# Patient Record
Sex: Female | Born: 1985 | Race: White | Hispanic: No | Marital: Married | Smoking: Never smoker
Health system: Southern US, Community
[De-identification: ages and names within clinical notes are randomized; demographics above are authoritative.]

## PROBLEM LIST (undated history)

## (undated) DIAGNOSIS — N979 Female infertility, unspecified: Secondary | ICD-10-CM

## (undated) DIAGNOSIS — F419 Anxiety disorder, unspecified: Secondary | ICD-10-CM

## (undated) DIAGNOSIS — Z8742 Personal history of other diseases of the female genital tract: Secondary | ICD-10-CM

## (undated) DIAGNOSIS — I499 Cardiac arrhythmia, unspecified: Secondary | ICD-10-CM

## (undated) DIAGNOSIS — I480 Paroxysmal atrial fibrillation: Secondary | ICD-10-CM

## (undated) DIAGNOSIS — O24419 Gestational diabetes mellitus in pregnancy, unspecified control: Secondary | ICD-10-CM

## (undated) HISTORY — PX: WISDOM TOOTH EXTRACTION: SHX21

## (undated) HISTORY — PX: BREAST SURGERY: SHX581

## (undated) HISTORY — PX: DILATION AND CURETTAGE OF UTERUS: SHX78

## (undated) HISTORY — DX: Paroxysmal atrial fibrillation: I48.0

## (undated) HISTORY — DX: Anxiety disorder, unspecified: F41.9

## (undated) HISTORY — DX: Gestational diabetes mellitus in pregnancy, unspecified control: O24.419

## (undated) HISTORY — DX: Personal history of other diseases of the female genital tract: Z87.42

## (undated) HISTORY — PX: OTHER SURGICAL HISTORY: SHX169

---
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
## 2009-09-29 ENCOUNTER — Ambulatory Visit (HOSPITAL_COMMUNITY): Admission: RE | Admit: 2009-09-29 | Discharge: 2009-09-29 | Payer: Self-pay | Admitting: Family Medicine

## 2010-02-08 ENCOUNTER — Emergency Department (HOSPITAL_COMMUNITY): Admission: EM | Admit: 2010-02-08 | Discharge: 2010-02-08 | Payer: Self-pay | Admitting: Family Medicine

## 2010-08-18 ENCOUNTER — Encounter: Payer: Self-pay | Admitting: Family Medicine

## 2010-08-18 ENCOUNTER — Ambulatory Visit (INDEPENDENT_AMBULATORY_CARE_PROVIDER_SITE_OTHER): Payer: Commercial Managed Care - PPO | Admitting: Family Medicine

## 2010-08-18 DIAGNOSIS — K219 Gastro-esophageal reflux disease without esophagitis: Secondary | ICD-10-CM | POA: Insufficient documentation

## 2010-08-21 ENCOUNTER — Telehealth (INDEPENDENT_AMBULATORY_CARE_PROVIDER_SITE_OTHER): Payer: Self-pay | Admitting: *Deleted

## 2010-08-25 NOTE — Assessment & Plan Note (Signed)
Summary: ACID REFLUX/ESOPHAGEAL ERROISIN rm 4   Vital Signs:  Patient Profile:   25 Years Old Female CC:      GERD Height:     63 inches Weight:      112.25 pounds O2 Sat:      100 % O2 treatment:    Room Air Temp:     98.8 degrees F oral Pulse rate:   80 / minute Resp:     14 per minute BP sitting:   108 / 73  (left arm) Cuff size:   regular  Vitals Entered By: Clemens Catholic LPN (August 18, 2010 10:18 AM)                  Updated Prior Medication List: TRI-SPRINTEC 0.18/0.215/0.25 MG-35 MCG TABS (NORGESTIM-ETH ESTRAD TRIPHASIC)  DOXYCYCLINE HYCLATE 100 MG SOLR (DOXYCYCLINE HYCLATE)   Current Allergies: No known allergies History of Present Illness Chief Complaint: GERD History of Present Illness:  Subjective:  Patient reports that she awakened 4 days ago with severe heartburn.  It improved slightly with OTC Zantac, but she now has pain with swallowing both solids and liquids.  The discomfort is lower sternal area.  No nausea/vomiting.  Bowel movements have been normal.  No past history of GI problems.  She does not smoke.  She admits that she is under increased stress.  She admits that she eats bedtime snacks, eats chocolate frequently, and likes caffeine containing drinks.  No respiratory or GU symptoms.  REVIEW OF SYSTEMS Constitutional Symptoms      Denies fever, chills, night sweats, weight loss, weight gain, and fatigue.  Eyes       Denies change in vision, eye pain, eye discharge, glasses, contact lenses, and eye surgery. Ear/Nose/Throat/Mouth       Complains of sore throat.      Denies hearing loss/aids, change in hearing, ear pain, ear discharge, dizziness, frequent runny nose, frequent nose bleeds, sinus problems, hoarseness, and tooth pain or bleeding.  Respiratory       Denies dry cough, productive cough, wheezing, shortness of breath, asthma, bronchitis, and emphysema/COPD.  Cardiovascular       Denies murmurs, chest pain, and tires easily with  exhertion.    Gastrointestinal       Complains of stomach pain.      Denies nausea/vomiting, diarrhea, constipation, blood in bowel movements, and indigestion. Genitourniary       Denies painful urination, kidney stones, and loss of urinary control. Neurological       Denies paralysis, seizures, and fainting/blackouts. Musculoskeletal       Denies muscle pain, joint pain, joint stiffness, decreased range of motion, redness, swelling, muscle weakness, and gout.  Skin       Denies bruising, unusual mles/lumps or sores, and hair/skin or nail changes.  Psych       Denies mood changes, temper/anger issues, anxiety/stress, speech problems, depression, and sleep problems. Other Comments: pt c/o stomach pain, sore throat, and reflux x Saturday. she states that when she eats that her throat, down to the center of her chest burns. she has taken Zantac and Tums which helped Saturday, but is no longer helping.   Past History:  Past Medical History: Unremarkable  Past Surgical History: Denies surgical history  Family History: none  Social History: Never Smoked Alcohol use-no Drug use-no Smoking Status:  never Drug Use:  no   Objective:  Appearance:  Patient appears healthy, stated age, and in no acute distress  Eyes:  Pupils are  equal, round, and reactive to light and accomdation.  Extraocular movement is intact.  Conjunctivae are not inflamed.  Mouth/pharynx:  Normal Neck:  Supple.  No adenopathy is present.  Lungs:  Clear to auscultation.  Breath sounds are equal.  Heart:  Regular rate and rhythm without murmurs, rubs, or gallops.  Abdomen:  Mild tenderness sub-xiphoid epigastric area without masses or hepatosplenomegaly.  Bowel sounds are present.  No CVA or flank tenderness.  Assessment New Problems: GERD (ICD-530.81)   Plan New Medications/Changes: SUCRALFATE 1 GM/10ML SUSP (SUCRALFATE) 10 ml by mouth three times a day to qid  #280 ml x 1, 08/18/2010, Donna Christen  MD NEXIUM 40 MG CPDR (ESOMEPRAZOLE MAGNESIUM) 1 by mouth daily ac  #30 x 1, 08/18/2010, Donna Christen MD  New Orders: New Patient Level III [id number] Planning Comments:   Begin Nexium 40mg  once daily.  Begin Carafate suspension 3 or 4 times daily for symptomatic improvement (advised to take Nexium on an empty stomach about 30 minutes before Carafate). Discussed reflux precautions.  Continue the Nexium for about 6 weeks.  Follow-up with GI if not improving 10 to 14 days.   The patient and/or caregiver has been counseled thoroughly with regard to medications prescribed including dosage, schedule, interactions, rationale for use, and possible side effects and they verbalize understanding.  Diagnoses and expected course of recovery discussed and will return if not improved as expected or if the condition worsens. Patient and/or caregiver verbalized understanding.  Prescriptions: SUCRALFATE 1 GM/10ML SUSP (SUCRALFATE) 10 ml by mouth three times a day to qid  #280 ml x 1   Entered and Authorized by:   Donna Christen MD   Signed by:   Donna Christen MD on 08/18/2010   Method used:   Print then Give to Patient   RxID:   [id number] NEXIUM 40 MG CPDR (ESOMEPRAZOLE MAGNESIUM) 1 by mouth daily ac  #30 x 1   Entered and Authorized by:   Donna Christen MD   Signed by:   Donna Christen MD on 08/18/2010   Method used:   Print then Give to Patient   RxID:   [id number]   Orders Added: 1)  New Patient Level III [29528]

## 2010-08-25 NOTE — Progress Notes (Signed)
  Phone Note Outgoing Call   Call placed by: Clemens Catholic LPN,  August 21, 2010 3:11 PM Call placed to: Patient Summary of Call: call back: pt states that she is feeling much better. advised her to continue meds as prescribed and call back if she has any questions or concerns. Initial call taken by: Clemens Catholic LPN,  August 21, 2010 3:11 PM

## 2010-09-17 LAB — POCT URINALYSIS DIPSTICK
Bilirubin Urine: NEGATIVE
Glucose, UA: NEGATIVE mg/dL
Hgb urine dipstick: NEGATIVE
Ketones, ur: NEGATIVE mg/dL
Urobilinogen, UA: 0.2 mg/dL (ref 0.0–1.0)

## 2010-09-17 LAB — POCT PREGNANCY, URINE: Preg Test, Ur: NEGATIVE

## 2011-08-19 ENCOUNTER — Ambulatory Visit: Payer: Commercial Managed Care - PPO | Admitting: Physician Assistant

## 2011-08-26 ENCOUNTER — Ambulatory Visit (INDEPENDENT_AMBULATORY_CARE_PROVIDER_SITE_OTHER): Payer: Commercial Managed Care - PPO | Admitting: Physician Assistant

## 2011-08-26 ENCOUNTER — Encounter: Payer: Self-pay | Admitting: Physician Assistant

## 2011-08-26 VITALS — BP 115/71 | HR 71 | Ht 63.0 in | Wt 117.0 lb

## 2011-08-26 DIAGNOSIS — F411 Generalized anxiety disorder: Secondary | ICD-10-CM

## 2011-08-26 DIAGNOSIS — Z309 Encounter for contraceptive management, unspecified: Secondary | ICD-10-CM

## 2011-08-26 DIAGNOSIS — F419 Anxiety disorder, unspecified: Secondary | ICD-10-CM | POA: Insufficient documentation

## 2011-08-26 DIAGNOSIS — IMO0001 Reserved for inherently not codable concepts without codable children: Secondary | ICD-10-CM

## 2011-08-26 MED ORDER — SERTRALINE HCL 50 MG PO TABS: 50.0000 mg | ORAL_TABLET | Freq: Every day | ORAL | Status: DC

## 2011-08-26 MED ORDER — NORGESTIM-ETH ESTRAD TRIPHASIC 0.18/0.215/0.25 MG-25 MCG PO TABS: 1.0000 | ORAL_TABLET | Freq: Every day | ORAL | Status: DC

## 2011-08-26 NOTE — Patient Instructions (Signed)
Start Zoloft daily and follow up in 6-8 weeks.

## 2011-08-26 NOTE — Progress Notes (Signed)
  Subjective:    Patient ID: Jasmine Bell, female    DOB: [birth year], 26 y.o.   MRN: [id number]  HPI Patient presents to clinic today to establish care. We discussed patients past medical history and current concerns. She has had anxiety for years. Nothing makes better or worse. She comes from a long time of people with anxiety problems. She notices it all the time. 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 She notices that she can't shut her mind off and has to write numerous things to do on note pads constantly. She has in the past had to turn door knobs and light switches certain number of times every time she walked into a room. She has stopped that. She just wants to try medication to help her relax some. She does drink 1 glass of wine at night to relax.   Review of Systems     Objective:   Physical Exam  Constitutional: She is oriented to person, place, and time. She appears well-developed and well-nourished.  HENT:  Head: Normocephalic and atraumatic.  Cardiovascular: Normal rate, regular rhythm and normal heart sounds.   Pulmonary/Chest: Effort normal and breath sounds normal.  Neurological: She is alert and oriented to person, place, and time.  Psychiatric: She has a normal mood and affect. Her behavior is normal.          Assessment & Plan:  Anxiety- GAD-7 was 13. PHQ-9 was 0. Started patient on Zoloft 25 for 7 days then increase to 50mg  daily. F/U in 6-8 weeks. Instructed to use caution when drinking alcohol and encouraged to not to drink every night and see if medication helps without alcohol. Discussed potential side effects and risk of making depression worse. She was instructed to call if she had any problems.   Refilled birth control for 2 months. Patient was instructed to find OB or schedule an appointment here to manage her birth control needs. She understood that either OB needed to refill meds and perform pap or we did  but same person needed to do both. Last pap smear was almost a year ago but did not like OB and will be changing.

## 2011-09-22 ENCOUNTER — Encounter: Payer: Self-pay | Admitting: *Deleted

## 2011-09-26 ENCOUNTER — Encounter: Payer: Self-pay | Admitting: Physician Assistant

## 2011-09-26 ENCOUNTER — Ambulatory Visit (INDEPENDENT_AMBULATORY_CARE_PROVIDER_SITE_OTHER): Payer: Commercial Managed Care - PPO | Admitting: Physician Assistant

## 2011-09-26 VITALS — BP 97/64 | HR 65 | Ht 63.0 in | Wt 113.0 lb

## 2011-09-26 DIAGNOSIS — Z131 Encounter for screening for diabetes mellitus: Secondary | ICD-10-CM

## 2011-09-26 DIAGNOSIS — Z13220 Encounter for screening for lipoid disorders: Secondary | ICD-10-CM

## 2011-09-26 DIAGNOSIS — F411 Generalized anxiety disorder: Secondary | ICD-10-CM

## 2011-09-26 DIAGNOSIS — F419 Anxiety disorder, unspecified: Secondary | ICD-10-CM

## 2011-09-26 LAB — COMPREHENSIVE METABOLIC PANEL
ALT: 12 U/L (ref 0–35)
AST: 16 U/L (ref 0–37)
Alkaline Phosphatase: 40 U/L (ref 39–117)
Calcium: 10.1 mg/dL (ref 8.4–10.5)
Chloride: 104 mEq/L (ref 96–112)
Total Protein: 6.9 g/dL (ref 6.0–8.3)

## 2011-09-26 LAB — LIPID PANEL
LDL Cholesterol: 82 mg/dL (ref 0–99)
Total CHOL/HDL Ratio: 2.5 Ratio
Triglycerides: 84 mg/dL (ref <150)
VLDL: 17 mg/dL (ref 0–40)

## 2011-09-26 MED ORDER — SERTRALINE HCL 50 MG PO TABS: 50.0000 mg | ORAL_TABLET | Freq: Every day | ORAL | Status: DC

## 2011-09-26 NOTE — Progress Notes (Signed)
  Subjective:    Patient ID: Jasmine Bell, female    DOB: 10/07/1985, 26 y.o.   MRN: [id number]  HPI Patient doing great today and comes in to recheck after being on Zoloft 50mg  for over 1 month. She states she feels like she has more energy. Her anxiety has significantly decreased. She felt some nausea the first couple of days but since she has felt fine. She feels like it is at the appropriate dose and well controlled.   Review of Systems     Objective:   Physical Exam  Constitutional: She is oriented to person, place, and time. She appears well-developed and well-nourished.  HENT:  Head: Normocephalic and atraumatic.  Cardiovascular: Normal rate, regular rhythm and normal heart sounds.   Pulmonary/Chest: Effort normal and breath sounds normal. She has no wheezes.  Neurological: She is alert and oriented to person, place, and time.  Psychiatric: She has a normal mood and affect. Her behavior is normal.          Assessment & Plan:  Anxiety- GAD-7 was 2. PHQ-9 was 1.refilled Zoloft 50mg  for 6 months and then will recheck.

## 2011-09-26 NOTE — Patient Instructions (Signed)
Continue on Zoloft and follow up in 5-6 months.

## 2011-09-27 NOTE — Progress Notes (Signed)
Quick Note:  Call patient with results. Let them know all labs are within normal limits. ______ 

## 2012-01-23 ENCOUNTER — Ambulatory Visit (INDEPENDENT_AMBULATORY_CARE_PROVIDER_SITE_OTHER): Payer: Commercial Managed Care - PPO | Admitting: Family Medicine

## 2012-01-23 DIAGNOSIS — Z23 Encounter for immunization: Secondary | ICD-10-CM

## 2012-01-23 NOTE — Progress Notes (Signed)
  Subjective:    Patient ID: Jasmine Bell, female    DOB: 11-17-1985, 26 y.o.   MRN: [id number]  HPI Here for tdap injection.   Review of Systems     Objective:   Physical Exam        Assessment & Plan:

## 2012-03-22 ENCOUNTER — Other Ambulatory Visit: Payer: Self-pay | Admitting: Physician Assistant

## 2012-05-22 ENCOUNTER — Other Ambulatory Visit: Payer: Self-pay | Admitting: Family Medicine

## 2012-05-22 NOTE — Telephone Encounter (Signed)
Needs appointment

## 2012-07-06 ENCOUNTER — Encounter: Payer: Self-pay | Admitting: Physician Assistant

## 2012-07-06 ENCOUNTER — Ambulatory Visit (INDEPENDENT_AMBULATORY_CARE_PROVIDER_SITE_OTHER): Payer: BC Managed Care – PPO | Admitting: Physician Assistant

## 2012-07-06 VITALS — BP 95/59 | HR 85 | Ht 63.0 in | Wt 123.0 lb

## 2012-07-06 DIAGNOSIS — F411 Generalized anxiety disorder: Secondary | ICD-10-CM

## 2012-07-06 DIAGNOSIS — Z79899 Other long term (current) drug therapy: Secondary | ICD-10-CM

## 2012-07-06 DIAGNOSIS — G4700 Insomnia, unspecified: Secondary | ICD-10-CM

## 2012-07-06 MED ORDER — SERTRALINE HCL 50 MG PO TABS: ORAL_TABLET | ORAL | Status: DC

## 2012-07-06 MED ORDER — TRAZODONE HCL 50 MG PO TABS: 50.0000 mg | ORAL_TABLET | Freq: Every day | ORAL | Status: DC

## 2012-07-06 NOTE — Progress Notes (Signed)
  Subjective:    Patient ID: Jasmine Bell, female    DOB: [birth year], 27 y.o.   MRN: [id number]  HPI Patient presents to the clinic to followup on anxiety. She has lost the results of Zoloft however she feels like her anxiety has increased some. She is and nurse anesthetist school. She is also having some problems sleeping at night. Her problem is more with getting to sleep and staying asleep. She has not tried anything to make this better. She denies any problems or issues with current medication.   Review of Systems     Objective:   Physical Exam  Constitutional: She is oriented to person, place, and time. She appears well-developed and well-nourished.  HENT:  Head: Normocephalic and atraumatic.  Cardiovascular: Normal rate, regular rhythm and normal heart sounds.   Pulmonary/Chest: Effort normal and breath sounds normal. She has no wheezes.  Neurological: She is alert and oriented to person, place, and time.  Skin: Skin is warm and dry.  Psychiatric: She has a normal mood and affect. Her behavior is normal.          Assessment & Plan:  GAD-GAD-7 was 5 and PHQ-9 was 4. will increase Zoloft to 1 one half tab daily to see if this helps with some of the extra anxiety. Did go ahead and give patient a year supply. If patient not controlled on current dose she was instructed to make a followup appointment to discuss any further treatment. Will check kidney and liver today for medication management. Follow up one year without any concerns.  Insomnia-discuss with patient the importance of trying natural remedies first. Would like her to develop a good night time routine and start using melatonin 3 mg and up to 10 mg one hour before bedtime. Patient aware this may take some time to get into the system. Did get a printed prescription for trazodone taking 50 mg one half tab at bedtime. It started this medication needs to followup one to 2 months after initiating therapy.

## 2012-07-06 NOTE — Patient Instructions (Addendum)
Melatonin 3mg  at night can increase to 10mg  at night 1 hour before bed. Increase Zoloft to 1 and 1/2 tab.

## 2012-07-07 LAB — COMPREHENSIVE METABOLIC PANEL
ALT: 8 U/L (ref 0–35)
AST: 13 U/L (ref 0–37)
BUN: 8 mg/dL (ref 6–23)
Calcium: 9.9 mg/dL (ref 8.4–10.5)
Glucose, Bld: 101 mg/dL — ABNORMAL HIGH (ref 70–99)
Total Protein: 6.9 g/dL (ref 6.0–8.3)

## 2013-07-09 ENCOUNTER — Other Ambulatory Visit: Payer: Self-pay | Admitting: Physician Assistant

## 2013-08-28 ENCOUNTER — Encounter: Payer: Self-pay | Admitting: Physician Assistant

## 2013-08-28 ENCOUNTER — Ambulatory Visit (INDEPENDENT_AMBULATORY_CARE_PROVIDER_SITE_OTHER): Payer: BC Managed Care – PPO | Admitting: Physician Assistant

## 2013-08-28 VITALS — BP 94/61 | HR 98 | Wt 127.0 lb

## 2013-08-28 DIAGNOSIS — Z79899 Other long term (current) drug therapy: Secondary | ICD-10-CM

## 2013-08-28 DIAGNOSIS — F411 Generalized anxiety disorder: Secondary | ICD-10-CM

## 2013-08-28 MED ORDER — SERTRALINE HCL 50 MG PO TABS: ORAL_TABLET | ORAL | Status: DC

## 2013-08-28 NOTE — Progress Notes (Signed)
   Subjective:    Patient ID: Merdis Delay, female    DOB: 1986-02-28, 28 y.o.   MRN: [id number]  HPI Patient is a 28 year old female who presents to the clinic to followup on generalized anxiety. She's doing great on Zoloft 50 mg daily. She has no complaints or concerns. Has been one year since last visit. She is plenty of energy and feels well focus. She is currently having no problems with sleep. Patient is currently in her last semester of school and doing great.   Review of Systems     Objective:   Physical Exam  Constitutional: She appears well-developed and well-nourished.  HENT:  Head: Normocephalic and atraumatic.  Cardiovascular: Normal rate, regular rhythm and normal heart sounds.   Pulmonary/Chest: Effort normal and breath sounds normal.  Skin: Skin is warm and dry.  Psychiatric: She has a normal mood and affect. Her behavior is normal.          Assessment & Plan:  GAD-GAD 7 was 1. Patient is very well controlled. One-year supply was sent to pharmacy. Followup in one year. Went ahead and ordered CMP to check liver and kidney.

## 2013-08-29 LAB — COMPLETE METABOLIC PANEL WITH GFR
ALBUMIN: 4.6 g/dL (ref 3.5–5.2)
ALT: 10 U/L (ref 0–35)
AST: 18 U/L (ref 0–37)
Alkaline Phosphatase: 49 U/L (ref 39–117)
BILIRUBIN TOTAL: 0.6 mg/dL (ref 0.2–1.2)
BUN: 9 mg/dL (ref 6–23)
CALCIUM: 9.3 mg/dL (ref 8.4–10.5)
CO2: 26 meq/L (ref 19–32)
Chloride: 104 mEq/L (ref 96–112)
Creat: 0.58 mg/dL (ref 0.50–1.10)
GFR, Est African American: >89 mL/min
GFR, Est Non African American: >89 mL/min
GLUCOSE: 87 mg/dL (ref 70–99)
Potassium: 3.7 mEq/L (ref 3.5–5.3)
SODIUM: 139 meq/L (ref 135–145)
Total Protein: 7.1 g/dL (ref 6.0–8.3)

## 2014-09-29 ENCOUNTER — Other Ambulatory Visit: Payer: Self-pay | Admitting: Physician Assistant

## 2014-12-15 ENCOUNTER — Other Ambulatory Visit: Payer: Self-pay | Admitting: Physician Assistant

## 2015-01-08 ENCOUNTER — Other Ambulatory Visit: Payer: Self-pay | Admitting: Physician Assistant

## 2015-01-15 ENCOUNTER — Ambulatory Visit: Payer: Self-pay | Admitting: Family Medicine

## 2015-01-16 ENCOUNTER — Ambulatory Visit: Payer: Self-pay | Admitting: Family Medicine

## 2015-01-20 ENCOUNTER — Encounter: Payer: Self-pay | Admitting: Family Medicine

## 2015-01-20 ENCOUNTER — Ambulatory Visit (INDEPENDENT_AMBULATORY_CARE_PROVIDER_SITE_OTHER): Payer: 59 | Admitting: Family Medicine

## 2015-01-20 VITALS — BP 106/68 | HR 74 | Ht 63.0 in | Wt 123.0 lb

## 2015-01-20 DIAGNOSIS — F411 Generalized anxiety disorder: Secondary | ICD-10-CM | POA: Diagnosis not present

## 2015-01-20 MED ORDER — SERTRALINE HCL 50 MG PO TABS: 75.0000 mg | ORAL_TABLET | Freq: Every day | ORAL | Status: DC

## 2015-01-20 NOTE — Progress Notes (Signed)
Jasmine Bell is a 29 y.o. female who presents to [facility]  today for follow-up anxiety. Patient has a history of generalized anxiety disorder. This is currently very well managed with 75 mg daily of Zoloft. She is a Immunologist and feels comfortable at work. No SI or HI. No current issues.   Past Medical History  Diagnosis Date  . Anxiety    Past Surgical History  Procedure Laterality Date  . Breast surgery      areolar cyst removal  . Aerolar cyst on breast removed     History  Substance Use Topics  . Smoking status: Never Smoker   . Smokeless tobacco: Never Used  . Alcohol Use: No   ROS as above Medications: Current Outpatient Prescriptions  Medication Sig Dispense Refill  . sertraline (ZOLOFT) 50 MG tablet Take 1.5 tablets (75 mg total) by mouth daily. 135 tablet 3   No current facility-administered medications for this visit.   No Known Allergies   Exam:  BP 106/68 mmHg  Pulse 74  Ht 5\' 3"  (1.6 m)  Wt 123 lb (55.792 kg)  BMI 21.79 kg/m2 Gen: Well NAD HEENT: EOMI,  MMM Lungs: Normal work of breathing. CTABL Heart: RRR no MRG Abd: NABS, Soft. Nondistended, Nontender Exts: Brisk capillary refill, warm and well perfused.  Psych: Alert and oriented normal affect thought process speech and a HI expressed. GAD 7 is 1. PHQ9 is 0  No results found for this or any previous visit (from the past 24 hour(s)). No results found.   Please see individual assessment and plan sections.

## 2015-01-20 NOTE — Assessment & Plan Note (Signed)
Generalized anxiety disorder well controlled with 75 mg of Zoloft daily. Refill for one year supply. Return in one year as needed. Of note patient is attempting to become pregnant. Discussed safety of this medication while pregnant.

## 2015-07-05 NOTE — L&D Delivery Note (Addendum)
Operative Delivery Note At 9:28 PM a viable female was delivered via Vaginal, Vacuum Neurosurgeon).  Presentation: vertex; Position: Left,, Occiput,, Anterior; Station: +2.  Verbal consent: obtained from patient.  Risks and benefits discussed in detail.  Risks include, but are not limited to the risks of anesthesia, bleeding, infection, damage to maternal tissues, fetal cephalhematoma.    APGAR: 4, 9; weight 7 lb 3.3 oz (3270 g).   Placenta status: , .   Cord:  with the following complications: .  Cord pH: pending  Anesthesia:  Epidural Instruments: kiwi vacuum Episiotomy: Median Lacerations: 2nd degree Suture Repair: 2-0 vicryl Est. Blood Loss (mL): 550  Mom to postpartum.  Baby to Couplet care / Skin to Skin  Neonatologist present for delivery Placenta delivered intact, shultz 853mcg of cytotec placed rectally after evacuation of blood clots; fundus firm.  Pt would like circ done while in hospital  [facility] 03/15/2016, 10:45 PM

## 2015-07-07 DIAGNOSIS — Z3183 Encounter for assisted reproductive fertility procedure cycle: Secondary | ICD-10-CM | POA: Diagnosis not present

## 2015-07-08 MED FILL — BD NEEDLES 22GX1.5: 22G X 1-1/2 | 30 days supply | Qty: 30 | Fill #1

## 2015-07-15 DIAGNOSIS — Z3201 Encounter for pregnancy test, result positive: Secondary | ICD-10-CM | POA: Diagnosis not present

## 2015-07-16 MED FILL — PROGESTERONE OIL 50 MG/ML V: 50 | 30 days supply | Qty: 30 | Fill #1

## 2015-07-17 DIAGNOSIS — Z3200 Encounter for pregnancy test, result unknown: Secondary | ICD-10-CM | POA: Diagnosis not present

## 2015-08-13 DIAGNOSIS — O0900 Supervision of pregnancy with history of infertility, unspecified trimester: Secondary | ICD-10-CM | POA: Diagnosis not present

## 2015-09-04 DIAGNOSIS — Z3A11 11 weeks gestation of pregnancy: Secondary | ICD-10-CM | POA: Diagnosis not present

## 2015-09-04 DIAGNOSIS — O09811 Supervision of pregnancy resulting from assisted reproductive technology, first trimester: Secondary | ICD-10-CM | POA: Diagnosis not present

## 2015-09-04 DIAGNOSIS — Z36 Encounter for antenatal screening of mother: Secondary | ICD-10-CM | POA: Diagnosis not present

## 2015-09-04 DIAGNOSIS — O0901 Supervision of pregnancy with history of infertility, first trimester: Secondary | ICD-10-CM | POA: Diagnosis not present

## 2015-09-04 LAB — OB RESULTS CONSOLE HEPATITIS B SURFACE ANTIGEN: Hepatitis B Surface Ag: NEGATIVE

## 2015-09-04 LAB — OB RESULTS CONSOLE GC/CHLAMYDIA
CHLAMYDIA, DNA PROBE: NEGATIVE
GC PROBE AMP, GENITAL: NEGATIVE

## 2015-09-04 LAB — OB RESULTS CONSOLE RPR: RPR: NONREACTIVE

## 2015-09-04 LAB — OB RESULTS CONSOLE RUBELLA ANTIBODY, IGM: Rubella: IMMUNE

## 2015-09-04 LAB — OB RESULTS CONSOLE HIV ANTIBODY (ROUTINE TESTING): HIV: NONREACTIVE

## 2015-09-16 DIAGNOSIS — Z3A12 12 weeks gestation of pregnancy: Secondary | ICD-10-CM | POA: Diagnosis not present

## 2015-09-16 DIAGNOSIS — Z36 Encounter for antenatal screening of mother: Secondary | ICD-10-CM | POA: Diagnosis not present

## 2015-09-16 DIAGNOSIS — O3111X0 Continuing pregnancy after spontaneous abortion of one fetus or more, first trimester, not applicable or unspecified: Secondary | ICD-10-CM | POA: Diagnosis not present

## 2015-09-18 DIAGNOSIS — O3111X0 Continuing pregnancy after spontaneous abortion of one fetus or more, first trimester, not applicable or unspecified: Secondary | ICD-10-CM | POA: Diagnosis not present

## 2015-09-18 DIAGNOSIS — Z3A13 13 weeks gestation of pregnancy: Secondary | ICD-10-CM | POA: Diagnosis not present

## 2015-09-18 DIAGNOSIS — O09811 Supervision of pregnancy resulting from assisted reproductive technology, first trimester: Secondary | ICD-10-CM | POA: Diagnosis not present

## 2015-09-18 DIAGNOSIS — O0901 Supervision of pregnancy with history of infertility, first trimester: Secondary | ICD-10-CM | POA: Diagnosis not present

## 2015-10-14 DIAGNOSIS — O3112X0 Continuing pregnancy after spontaneous abortion of one fetus or more, second trimester, not applicable or unspecified: Secondary | ICD-10-CM | POA: Diagnosis not present

## 2015-10-14 DIAGNOSIS — Z3A16 16 weeks gestation of pregnancy: Secondary | ICD-10-CM | POA: Diagnosis not present

## 2015-10-14 DIAGNOSIS — O09812 Supervision of pregnancy resulting from assisted reproductive technology, second trimester: Secondary | ICD-10-CM | POA: Diagnosis not present

## 2015-10-14 DIAGNOSIS — Z36 Encounter for antenatal screening of mother: Secondary | ICD-10-CM | POA: Diagnosis not present

## 2015-10-14 DIAGNOSIS — O0902 Supervision of pregnancy with history of infertility, second trimester: Secondary | ICD-10-CM | POA: Diagnosis not present

## 2015-10-30 DIAGNOSIS — O09812 Supervision of pregnancy resulting from assisted reproductive technology, second trimester: Secondary | ICD-10-CM | POA: Diagnosis not present

## 2015-10-30 DIAGNOSIS — Z36 Encounter for antenatal screening of mother: Secondary | ICD-10-CM | POA: Diagnosis not present

## 2015-10-30 DIAGNOSIS — O3112X1 Continuing pregnancy after spontaneous abortion of one fetus or more, second trimester, fetus 1: Secondary | ICD-10-CM | POA: Diagnosis not present

## 2015-10-30 DIAGNOSIS — Z3A19 19 weeks gestation of pregnancy: Secondary | ICD-10-CM | POA: Diagnosis not present

## 2015-11-11 DIAGNOSIS — O09812 Supervision of pregnancy resulting from assisted reproductive technology, second trimester: Secondary | ICD-10-CM | POA: Diagnosis not present

## 2015-11-11 DIAGNOSIS — Z3A20 20 weeks gestation of pregnancy: Secondary | ICD-10-CM | POA: Diagnosis not present

## 2015-11-11 DIAGNOSIS — O3112X1 Continuing pregnancy after spontaneous abortion of one fetus or more, second trimester, fetus 1: Secondary | ICD-10-CM | POA: Diagnosis not present

## 2015-11-11 DIAGNOSIS — O0902 Supervision of pregnancy with history of infertility, second trimester: Secondary | ICD-10-CM | POA: Diagnosis not present

## 2015-12-22 DIAGNOSIS — D225 Melanocytic nevi of trunk: Secondary | ICD-10-CM | POA: Diagnosis not present

## 2015-12-22 DIAGNOSIS — L814 Other melanin hyperpigmentation: Secondary | ICD-10-CM | POA: Diagnosis not present

## 2016-01-11 DIAGNOSIS — O09813 Supervision of pregnancy resulting from assisted reproductive technology, third trimester: Secondary | ICD-10-CM | POA: Diagnosis not present

## 2016-01-11 DIAGNOSIS — Z36 Encounter for antenatal screening of mother: Secondary | ICD-10-CM | POA: Diagnosis not present

## 2016-01-11 DIAGNOSIS — Z6791 Unspecified blood type, Rh negative: Secondary | ICD-10-CM | POA: Diagnosis not present

## 2016-01-11 DIAGNOSIS — Z3A29 29 weeks gestation of pregnancy: Secondary | ICD-10-CM | POA: Diagnosis not present

## 2016-01-11 DIAGNOSIS — Z23 Encounter for immunization: Secondary | ICD-10-CM | POA: Diagnosis not present

## 2016-02-12 DIAGNOSIS — Z8742 Personal history of other diseases of the female genital tract: Secondary | ICD-10-CM | POA: Insufficient documentation

## 2016-02-24 DIAGNOSIS — Z36 Encounter for antenatal screening of mother: Secondary | ICD-10-CM | POA: Diagnosis not present

## 2016-02-24 LAB — OB RESULTS CONSOLE GC/CHLAMYDIA

## 2016-02-24 LAB — OB RESULTS CONSOLE GBS: STREP GROUP B AG: POSITIVE

## 2016-03-11 DIAGNOSIS — Z23 Encounter for immunization: Secondary | ICD-10-CM | POA: Diagnosis not present

## 2016-03-11 DIAGNOSIS — Z3A38 38 weeks gestation of pregnancy: Secondary | ICD-10-CM | POA: Diagnosis not present

## 2016-03-11 DIAGNOSIS — O0903 Supervision of pregnancy with history of infertility, third trimester: Secondary | ICD-10-CM | POA: Diagnosis not present

## 2016-03-15 ENCOUNTER — Inpatient Hospital Stay (HOSPITAL_COMMUNITY): Payer: 59 | Admitting: Anesthesiology

## 2016-03-15 ENCOUNTER — Inpatient Hospital Stay (HOSPITAL_COMMUNITY)
Admission: AD | Admit: 2016-03-15 | Discharge: 2016-03-17 | DRG: 775 | Disposition: A | Discharge status: Home / Self Care | Payer: 59 | Source: Ambulatory Visit | Attending: Obstetrics and Gynecology | Admitting: Obstetrics and Gynecology

## 2016-03-15 ENCOUNTER — Encounter (HOSPITAL_COMMUNITY): Payer: Self-pay | Admitting: *Deleted

## 2016-03-15 DIAGNOSIS — Z833 Family history of diabetes mellitus: Secondary | ICD-10-CM

## 2016-03-15 DIAGNOSIS — K219 Gastro-esophageal reflux disease without esophagitis: Secondary | ICD-10-CM | POA: Diagnosis present

## 2016-03-15 DIAGNOSIS — Z3A38 38 weeks gestation of pregnancy: Secondary | ICD-10-CM | POA: Diagnosis not present

## 2016-03-15 DIAGNOSIS — O99824 Streptococcus B carrier state complicating childbirth: Secondary | ICD-10-CM | POA: Diagnosis present

## 2016-03-15 DIAGNOSIS — Z823 Family history of stroke: Secondary | ICD-10-CM | POA: Diagnosis not present

## 2016-03-15 DIAGNOSIS — Z3403 Encounter for supervision of normal first pregnancy, third trimester: Secondary | ICD-10-CM | POA: Diagnosis present

## 2016-03-15 DIAGNOSIS — O9962 Diseases of the digestive system complicating childbirth: Secondary | ICD-10-CM | POA: Diagnosis present

## 2016-03-15 LAB — CBC
HCT: 36.6 % (ref 36.0–46.0)
HEMOGLOBIN: 12.9 g/dL (ref 12.0–15.0)
MCH: 32.3 pg (ref 26.0–34.0)
MCHC: 35.2 g/dL (ref 30.0–36.0)
MCV: 91.7 fL (ref 78.0–100.0)
Platelets: 168 10*3/uL (ref 150–400)
RBC: 3.99 MIL/uL (ref 3.87–5.11)
RDW: 12.7 % (ref 11.5–15.5)
WBC: 14.7 10*3/uL — AB (ref 4.0–10.5)

## 2016-03-15 LAB — RPR: RPR Ser Ql: NONREACTIVE

## 2016-03-15 MED ORDER — COCONUT OIL OIL
1.0000 "application " | TOPICAL_OIL | Status: DC | PRN
  Administered 2016-03-16: 1 via TOPICAL
  Filled 2016-03-15: qty 120

## 2016-03-15 MED ORDER — BENZOCAINE-MENTHOL 20-0.5 % EX AERO
1.0000 "application " | INHALATION_SPRAY | CUTANEOUS | Status: DC | PRN
  Administered 2016-03-16: 1 via TOPICAL
  Filled 2016-03-15: qty 56

## 2016-03-15 MED ORDER — LIDOCAINE HCL (PF) 1 % IJ SOLN
30.0000 mL | INTRAMUSCULAR | Status: DC | PRN
  Administered 2016-03-15: 30 mL via SUBCUTANEOUS
  Filled 2016-03-15: qty 30

## 2016-03-15 MED ORDER — SENNOSIDES-DOCUSATE SODIUM 8.6-50 MG PO TABS
2.0000 | ORAL_TABLET | ORAL | Status: DC
  Administered 2016-03-16 (×2): 2 via ORAL
  Filled 2016-03-15 (×2): qty 2

## 2016-03-15 MED ORDER — OXYTOCIN BOLUS FROM INFUSION: 500.0000 mL | Freq: Once | INTRAVENOUS | Status: DC

## 2016-03-15 MED ORDER — BUTORPHANOL TARTRATE 1 MG/ML IJ SOLN
1.0000 mg | INTRAMUSCULAR | Status: DC | PRN
  Filled 2016-03-15: qty 1

## 2016-03-15 MED ORDER — ZOLPIDEM TARTRATE 5 MG PO TABS: 5.0000 mg | ORAL_TABLET | Freq: Every evening | ORAL | Status: DC | PRN

## 2016-03-15 MED ORDER — MISOPROSTOL 200 MCG PO TABS
800.0000 ug | ORAL_TABLET | Freq: Once | ORAL | Status: AC
  Administered 2016-03-15: 800 ug via RECTAL

## 2016-03-15 MED ORDER — MISOPROSTOL 200 MCG PO TABS
ORAL_TABLET | ORAL | Status: AC
  Filled 2016-03-15: qty 4

## 2016-03-15 MED ORDER — PENICILLIN G POTASSIUM 5000000 UNITS IJ SOLR
2.5000 10*6.[IU] | INTRAVENOUS | Status: DC
  Administered 2016-03-15 (×3): 2.5 10*6.[IU] via INTRAVENOUS
  Filled 2016-03-15 (×5): qty 2.5

## 2016-03-15 MED ORDER — FLEET ENEMA 7-19 GM/118ML RE ENEM: 1.0000 | ENEMA | RECTAL | Status: DC | PRN

## 2016-03-15 MED ORDER — SIMETHICONE 80 MG PO CHEW: 80.0000 mg | CHEWABLE_TABLET | ORAL | Status: DC | PRN

## 2016-03-15 MED ORDER — PHENYLEPHRINE 40 MCG/ML (10ML) SYRINGE FOR IV PUSH (FOR BLOOD PRESSURE SUPPORT)
80.0000 ug | PREFILLED_SYRINGE | INTRAVENOUS | Status: DC | PRN
  Filled 2016-03-15: qty 5

## 2016-03-15 MED ORDER — SOD CITRATE-CITRIC ACID 500-334 MG/5ML PO SOLN: 30.0000 mL | ORAL | Status: DC | PRN

## 2016-03-15 MED ORDER — ONDANSETRON HCL 4 MG/2ML IJ SOLN
4.0000 mg | Freq: Four times a day (QID) | INTRAMUSCULAR | Status: DC | PRN
  Filled 2016-03-15: qty 2

## 2016-03-15 MED ORDER — DIPHENHYDRAMINE HCL 25 MG PO CAPS: 25.0000 mg | ORAL_CAPSULE | Freq: Four times a day (QID) | ORAL | Status: DC | PRN

## 2016-03-15 MED ORDER — LACTATED RINGERS IV SOLN: 500.0000 mL | Freq: Once | INTRAVENOUS | Status: DC

## 2016-03-15 MED ORDER — ACETAMINOPHEN 325 MG PO TABS: 650.0000 mg | ORAL_TABLET | ORAL | Status: DC | PRN

## 2016-03-15 MED ORDER — LACTATED RINGERS IV SOLN: 500.0000 mL | INTRAVENOUS | Status: DC | PRN

## 2016-03-15 MED ORDER — TERBUTALINE SULFATE 1 MG/ML IJ SOLN
0.2500 mg | Freq: Once | INTRAMUSCULAR | Status: DC | PRN
  Filled 2016-03-15: qty 1

## 2016-03-15 MED ORDER — ONDANSETRON HCL 4 MG PO TABS: 4.0000 mg | ORAL_TABLET | ORAL | Status: DC | PRN

## 2016-03-15 MED ORDER — PENICILLIN G POTASSIUM 5000000 UNITS IJ SOLR
5.0000 10*6.[IU] | Freq: Once | INTRAMUSCULAR | Status: AC
  Administered 2016-03-15: 5 10*6.[IU] via INTRAVENOUS
  Filled 2016-03-15: qty 5

## 2016-03-15 MED ORDER — ONDANSETRON HCL 4 MG/2ML IJ SOLN: 4.0000 mg | INTRAMUSCULAR | Status: DC | PRN

## 2016-03-15 MED ORDER — PHENYLEPHRINE 40 MCG/ML (10ML) SYRINGE FOR IV PUSH (FOR BLOOD PRESSURE SUPPORT)
80.0000 ug | PREFILLED_SYRINGE | INTRAVENOUS | Status: DC | PRN
Start: 2016-03-15 — End: 2016-03-15
  Filled 2016-03-15: qty 5

## 2016-03-15 MED ORDER — PHENYLEPHRINE 40 MCG/ML (10ML) SYRINGE FOR IV PUSH (FOR BLOOD PRESSURE SUPPORT)
PREFILLED_SYRINGE | INTRAVENOUS | Status: AC
  Filled 2016-03-15: qty 20

## 2016-03-15 MED ORDER — DIPHENHYDRAMINE HCL 50 MG/ML IJ SOLN: 12.5000 mg | INTRAMUSCULAR | Status: DC | PRN

## 2016-03-15 MED ORDER — TETANUS-DIPHTH-ACELL PERTUSSIS 5-2.5-18.5 LF-MCG/0.5 IM SUSP: 0.5000 mL | Freq: Once | INTRAMUSCULAR | Status: DC

## 2016-03-15 MED ORDER — LACTATED RINGERS IV SOLN: INTRAVENOUS | Status: DC

## 2016-03-15 MED ORDER — WITCH HAZEL-GLYCERIN EX PADS: 1.0000 "application " | MEDICATED_PAD | CUTANEOUS | Status: DC | PRN

## 2016-03-15 MED ORDER — OXYCODONE-ACETAMINOPHEN 5-325 MG PO TABS
1.0000 | ORAL_TABLET | ORAL | Status: DC | PRN
  Administered 2016-03-15 – 2016-03-16 (×6): 1 via ORAL
  Filled 2016-03-15 (×6): qty 1

## 2016-03-15 MED ORDER — LIDOCAINE HCL (PF) 1 % IJ SOLN
INTRAMUSCULAR | Status: DC | PRN
  Administered 2016-03-15 (×2): 4 mL

## 2016-03-15 MED ORDER — OXYTOCIN 40 UNITS IN LACTATED RINGERS INFUSION - SIMPLE MED
1.0000 m[IU]/min | INTRAVENOUS | Status: DC
  Administered 2016-03-15: 2 m[IU]/min via INTRAVENOUS
  Filled 2016-03-15: qty 1000

## 2016-03-15 MED ORDER — EPHEDRINE 5 MG/ML INJ
10.0000 mg | INTRAVENOUS | Status: DC | PRN
  Filled 2016-03-15: qty 4

## 2016-03-15 MED ORDER — DOCUSATE SODIUM 100 MG PO CAPS
100.0000 mg | ORAL_CAPSULE | Freq: Two times a day (BID) | ORAL | Status: DC
  Administered 2016-03-16 – 2016-03-17 (×3): 100 mg via ORAL
  Filled 2016-03-15 (×3): qty 1

## 2016-03-15 MED ORDER — FENTANYL 2.5 MCG/ML BUPIVACAINE 1/10 % EPIDURAL INFUSION (WH - ANES)
14.0000 mL/h | INTRAMUSCULAR | Status: DC | PRN
  Administered 2016-03-15 (×3): 14 mL/h via EPIDURAL
  Filled 2016-03-15 (×3): qty 125

## 2016-03-15 MED ORDER — OXYTOCIN 40 UNITS IN LACTATED RINGERS INFUSION - SIMPLE MED: 2.5000 [IU]/h | INTRAVENOUS | Status: DC

## 2016-03-15 MED ORDER — OXYCODONE-ACETAMINOPHEN 5-325 MG PO TABS
2.0000 | ORAL_TABLET | ORAL | Status: DC | PRN
  Administered 2016-03-16: 2 via ORAL
  Filled 2016-03-15: qty 2

## 2016-03-15 MED ORDER — DIBUCAINE 1 % RE OINT: 1.0000 "application " | TOPICAL_OINTMENT | RECTAL | Status: DC | PRN

## 2016-03-15 MED ORDER — PRENATAL MULTIVITAMIN CH
1.0000 | ORAL_TABLET | Freq: Every day | ORAL | Status: DC
  Administered 2016-03-16: 1 via ORAL
  Filled 2016-03-15: qty 1

## 2016-03-15 MED ORDER — PRENATAL MULTIVITAMIN CH: 1.0000 | ORAL_TABLET | Freq: Every day | ORAL | Status: DC

## 2016-03-15 MED ORDER — IBUPROFEN 600 MG PO TABS
600.0000 mg | ORAL_TABLET | Freq: Four times a day (QID) | ORAL | Status: DC
  Administered 2016-03-16 – 2016-03-17 (×6): 600 mg via ORAL
  Filled 2016-03-15 (×6): qty 1

## 2016-03-15 NOTE — Consult Note (Signed)
Neonatology Note:   Attendance at Delivery:    I was asked by Dr. Terri Piedra to attend this vaginal delivery due to decels with need for vacuum assist at term. The mother is a G1, GBS positive with aIAP and good prenatal care. ROM 10 hours before delivery, fluid clear, with temp to 37.8. Infant not vigorous and without good spontaneous cry or tone. Immediately brought to warmer and dried and stimulated. HR >100. "Stunned' appearance with little resp effort.  Bulb suctioned large amount of clear secretions from oropharynx.  PPV applied and infant with gradual improvement in cry and activity level. Sao2 placed and appropriate.  Taken off cpap and stable on RA with great cry and movement.  Clavicles intact, molding.  Father cut cord.  Introduced to mother with skin to skin.  Ap 4/9.  To CN to care of Pediatrician.  Jasmine Sabal Katherina Mires, MD

## 2016-03-15 NOTE — Progress Notes (Signed)
Patient ID: Jasmine Bell, female   DOB: 29-Apr-1986, 30 y.o.   MRN: [id number] Pt complete and at 0 station.  Feels nauseated and appreciating some contractions  Will give some zofran then do trial pushes- if does well will plan for svd; if not,  will labor down then try in an hour

## 2016-03-15 NOTE — H&P (Signed)
Jasmine Bell is a 30 y.o. female, G1P0, EGA 38+ weeks with EDC 9-22 presenting for ctx.  On eval in [facility], she had ctx q 4-5 minutes, per RN cervix changed from 1 to 3 cm.  PNC complicated by conception by IVF.  OB History    Gravida Para Term Preterm AB Living   1             SAB TAB Ectopic Multiple Live Births                 Past Medical History:  Diagnosis Date  . Anxiety    Past Surgical History:  Procedure Laterality Date  . aerolar cyst on breast removed    . BREAST SURGERY     areolar cyst removal   Family History: family history includes Bipolar disorder in her paternal grandfather; Breast cancer in her maternal aunt; Depression in her father; Diabetes in her paternal aunt; Stroke in her maternal grandfather; Testicular cancer in her maternal grandfather. Social History:  reports that she has never smoked. She has never used smokeless tobacco. She reports that she does not drink alcohol or use drugs.     Maternal Diabetes: No Genetic Screening: Normal Maternal Ultrasounds/Referrals: Normal Fetal Ultrasounds or other Referrals:  None Maternal Substance Abuse:  No Significant Maternal Medications:  None Significant Maternal Lab Results:  Lab values include: Group B Strep positive Other Comments:  None  Review of Systems  Respiratory: Negative.   Cardiovascular: Negative.    Maternal Medical History:  Reason for admission: Contractions.   Contractions: Frequency: regular.   Perceived severity is moderate.    Fetal activity: Perceived fetal activity is normal.    Prenatal complications: no prenatal complications   Dilation: 3 Effacement (%): 80 Station: -3 Exam by:: Jasmine Bell RNC Blood pressure 124/73, pulse 96, temperature 98.5 F (36.9 C), temperature source Oral, resp. rate 16, height 5\' 1"  (1.549 m), weight 71.7 kg (158 lb), SpO2 100 %. Maternal Exam:  Uterine Assessment: Contraction strength is moderate.  Contraction frequency is regular.    Abdomen: Patient reports no abdominal tenderness. Estimated fetal weight is 7 lbs.   Fetal presentation: vertex  Introitus: Normal vulva. Normal vagina.  Amniotic fluid character: not assessed.  Pelvis: adequate for delivery.   Cervix: Cervix evaluated by digital exam.     Fetal Exam Fetal Monitor Review: Mode: ultrasound.   Baseline rate: 130.  Variability: moderate (6-25 bpm).   Pattern: accelerations present and no decelerations.    Fetal State Assessment: Category I - tracings are normal.     Physical Exam  Vitals reviewed. Constitutional: She appears well-developed and well-nourished.  Respiratory: No respiratory distress.  GI: Soft.    Prenatal labs: ABO, Rh:  O neg Antibody:  neg Rubella:  Immune RPR:   NR HBsAg:   Neg HIV:   NR GBS:   pos  Assessment/Plan: IUP at 38+ weeks in early labor, +GBS.  Will admit and monitor progress, PCN for +GBS.   Jasmine Bell D 03/15/2016, 7:13 AM

## 2016-03-15 NOTE — Progress Notes (Signed)
Jasmine Bell is a 30 y.o. G1P0 at 64w4dadmitted for active labor  Subjective:   Objective: BP 113/60   Pulse 78   Temp 98.5 F (36.9 C) (Oral)   Resp 16   Ht 5\' 1"  (1.549 m)   Wt 158 lb (71.7 kg)   SpO2 100%   BMI 29.85 kg/m  No intake/output data recorded. No intake/output data recorded.  FHT:  FHR: 140 bpm, variability: moderate,  accelerations:  Present,  decelerations:  Absent UC:   irregular, every 6 minutes SVE:   Dilation: 4 Effacement (%): 70 Station: -2 Exam by:: dr Terri Piedra  Labs: Lab Results  Component Value Date   WBC 14.7 (H) 03/15/2016   HGB 12.9 03/15/2016   HCT 36.6 03/15/2016   MCV 91.7 03/15/2016   PLT 168 03/15/2016    Assessment / Plan: Spontaneous labor, progressing normally  Labor: will start on pitocin; AROM performed with scant return of fluid Preeclampsia:  n/a Fetal Wellbeing:  Category I Pain Control:  Epidural I/D:  n/a Anticipated MOD:  NSVD  Jasmine Bell 03/15/2016, 10:07 AM

## 2016-03-15 NOTE — Progress Notes (Signed)
Patient ID: AMBRI DA, female   DOB: Mar 04, 1986, 30 y.o.   MRN: [id number] Pt doing well. Still comfortable with epidural.  VSS EFM - 140s, +accels, -decels, cat 1 TOCO - contractions q 67mins SVE 4.5/90/-2  A/P: Progressing well in labor         GBS prophylaxis         Pit at 94mus; continue per protocol         Anticipate svd

## 2016-03-15 NOTE — Anesthesia Pain Management Evaluation Note (Signed)
  CRNA Pain Management Visit Note  Patient: Jasmine Bell, 30 y.o., female  "Hello I am a member of the anesthesia team at [facility]. We have an anesthesia team available at all times to provide care throughout the hospital, including epidural management and anesthesia for C-section. I don't know your plan for the delivery whether it a natural birth, water birth, IV sedation, nitrous supplementation, doula or epidural, but we want to meet your pain goals."   1.Was your pain managed to your expectations on prior hospitalizations?   No prior hospitalizations  2.What is your expectation for pain management during this hospitalization?     Epidural  3.How can we help you reach that goal? Placed my epidural when contractions were too intense  Record the patient's initial score and the patient's pain goal.   Pain: 0  Pain Goal: 8 [facility] wants you to be able to say your pain was always managed very well.  Sallie Maker 03/15/2016

## 2016-03-15 NOTE — Anesthesia Procedure Notes (Signed)
Epidural Patient location during procedure: OB  Staffing Anesthesiologist: Lauretta Grill Performed: anesthesiologist   Preanesthetic Checklist Completed: patient identified, site marked, surgical consent, pre-op evaluation, timeout performed, IV checked, risks and benefits discussed and monitors and equipment checked  Epidural Patient position: sitting Prep: site prepped and draped and DuraPrep Patient monitoring: continuous pulse ox and blood pressure Approach: midline Location: L3-L4 Injection technique: LOR saline  Needle:  Needle type: Tuohy  Needle gauge: 17 G Needle length: 9 cm and 9 Needle insertion depth: 5 cm cm Catheter type: closed end flexible Catheter size: 19 Gauge Catheter at skin depth: 9 cm Test dose: negative  Assessment Events: blood not aspirated, injection not painful, no injection resistance, negative IV test and no paresthesia  Additional Notes Patient identified. Risks/Benefits/Options discussed with patient including but not limited to bleeding, infection, nerve damage, paralysis, failed block, incomplete pain control, headache, blood pressure changes, nausea, vomiting, reactions to medication both or allergic, itching and postpartum back pain. Confirmed with bedside nurse the patient's most recent platelet count. Confirmed with patient that they are not currently taking any anticoagulation, have any bleeding history or any family history of bleeding disorders. Patient expressed understanding and wished to proceed. All questions were answered. Sterile technique was used throughout the entire procedure. Please see nursing notes for vital signs. Test dose was given through epidural catheter and negative prior to continuing to dose epidural or start infusion. Warning signs of high block given to the patient including shortness of breath, tingling/numbness in hands, complete motor block, or any concerning symptoms with instructions to call for help. Patient was  given instructions on fall risk and not to get out of bed. All questions and concerns addressed with instructions to call with any issues or inadequate analgesia.

## 2016-03-15 NOTE — Progress Notes (Signed)
Notified of pt arrival in MAU and exam. Will admit to labor and delivery.

## 2016-03-15 NOTE — Anesthesia Preprocedure Evaluation (Signed)
Anesthesia Evaluation  Patient identified by MRN, date of birth, ID band Patient awake    Reviewed: Allergy & Precautions, NPO status , Patient's Chart, lab work & pertinent test results  History of Anesthesia Complications (+) history of anesthetic complications  Airway Mallampati: II  TM Distance: >3 FB Neck ROM: Full    Dental no notable dental hx. (+) Dental Advisory Given   Pulmonary neg pulmonary ROS,    Pulmonary exam normal breath sounds clear to auscultation       Cardiovascular negative cardio ROS Normal cardiovascular exam Rhythm:Regular Rate:Normal     Neuro/Psych PSYCHIATRIC DISORDERS Anxiety negative neurological ROS     GI/Hepatic Neg liver ROS, GERD  ,  Endo/Other  negative endocrine ROS  Renal/GU negative Renal ROS  negative genitourinary   Musculoskeletal negative musculoskeletal ROS (+)   Abdominal   Peds negative pediatric ROS (+)  Hematology negative hematology ROS (+)   Anesthesia Other Findings   Reproductive/Obstetrics (+) Pregnancy                             Anesthesia Physical Anesthesia Plan  ASA: II  Anesthesia Plan: Epidural   Post-op Pain Management:    Induction:   Airway Management Planned:   Additional Equipment:   Intra-op Plan:   Post-operative Plan:   Informed Consent: I have reviewed the patients History and Physical, chart, labs and discussed the procedure including the risks, benefits and alternatives for the proposed anesthesia with the patient or authorized representative who has indicated his/her understanding and acceptance.   Dental advisory given  Plan Discussed with: CRNA  Anesthesia Plan Comments:         Anesthesia Quick Evaluation

## 2016-03-15 NOTE — MAU Note (Signed)
Pt reports contractions, denies bleeding or ROM.  

## 2016-03-16 ENCOUNTER — Encounter (HOSPITAL_COMMUNITY): Payer: Self-pay

## 2016-03-16 LAB — CBC
HCT: 25.9 % — ABNORMAL LOW (ref 36.0–46.0)
Hemoglobin: 9.1 g/dL — ABNORMAL LOW (ref 12.0–15.0)
MCH: 32.1 pg (ref 26.0–34.0)
MCHC: 34.7 g/dL (ref 30.0–36.0)
MCV: 92.5 fL (ref 78.0–100.0)
PLATELETS: 137 10*3/uL — AB (ref 150–400)
RBC: 2.8 MIL/uL — AB (ref 3.87–5.11)
RDW: 12.8 % (ref 11.5–15.5)
WBC: 19.7 10*3/uL — AB (ref 4.0–10.5)

## 2016-03-16 LAB — CCBB MATERNAL DONOR DRAW

## 2016-03-16 MED ORDER — SODIUM CHLORIDE 0.9 % IV SOLN
3.0000 g | Freq: Four times a day (QID) | INTRAVENOUS | Status: DC
  Filled 2016-03-16 (×5): qty 3

## 2016-03-16 MED ORDER — RHO D IMMUNE GLOBULIN 1500 UNIT/2ML IJ SOSY
300.0000 ug | PREFILLED_SYRINGE | Freq: Once | INTRAMUSCULAR | Status: AC
  Administered 2016-03-16: 300 ug via INTRAVENOUS
  Filled 2016-03-16: qty 2

## 2016-03-16 NOTE — Progress Notes (Signed)
Dr. Melba Coon updated on patient's status and that she had no blood clots and bleeding was normal. I told Dr. Melba Coon that there was an order to start Unasyn if patient had temp of 100.4 or BP of 160/100 to start it. Dr. Melba Coon stated to notify her if we needed to start Unasyn. Patient updated on plan of care.

## 2016-03-16 NOTE — Progress Notes (Signed)
Patient stated her pain level was a 3 at 1730.  Patient had difficulty getting her pain under control earlier today and requested 2 percocets to help control her pain.  Patient encouraged to keep on top of the pain.  Patient has a lot of perineal edema.  Will continue to monitor.

## 2016-03-16 NOTE — Progress Notes (Signed)
ANTIBIOTIC CONSULT NOTE - INITIAL  Pharmacy Consult for Gentamicin Indication: Intrapartum fever  No Known Allergies  Patient Measurements: Height: 5\' 1"  (154.9 cm) Weight: 158 lb (71.7 kg) IBW/kg (Calculated) : 47.8 Adjusted Body Weight:   Vital Signs: Temp: 99.6 F (37.6 C) (09/12 2359) Temp Source: Axillary (09/12 2331) BP: 96/57 (09/12 2359) Pulse Rate: 106 (09/12 2359)  Labs:  Recent Labs  03/15/16 0505  WBC 14.7*  HGB 12.9  PLT 168   No results for input(s): GENTTROUGH, GENTPEAK, GENTRANDOM in the last 72 hours.   Microbiology: Recent Results (from the past 720 hour(s))  OB RESULT CONSOLE Group B Strep     Status: None   Collection Time: 02/24/16 12:00 AM  Result Value Ref Range Status   GBS Positive  Final     Assessment: 30 y.o. female G1P1001 at [redacted]w[redacted]d delivered vaginally on 9/12 at 2128.  Pt febrile around 2100 (39 C) normalizing around midnight (percocet given at 2343). Starting Unasyn for intrapartum fever.   Plan:  Unasyn 3 grams Q 6 hours   Jasmine Bell 03/16/2016,12:14 AM

## 2016-03-16 NOTE — Progress Notes (Addendum)
At 0059 ambulated patient up to the bathroom to void. Pt was able to ambulate to the bathroom without dizziness or feeling lightheaded. After sitting on the toilet for about 5 minutes the pt stated she was dizzy and seeing spots. Was able to ambulate back to the bed. Unable to void at this time. Fundus was firm, midline, with small bleeding at that time.  Weighed pads and had an estimated 283ml of blood loss.

## 2016-03-16 NOTE — Lactation Note (Signed)
This note was copied from a baby's chart. Lactation Consultation Note  Patient Name: Jasmine Bell [id number] Date: 03/16/2016 Reason for consult: Initial assessment Assisted Mom with positioning for baby to latch. Mom has short nipple shafts but could latch in laid back position using breast compression. Took several attempts for baby to latch but once he did baby demonstrated good suckling bursts with some swallowing motions noted. FOB assisting Mom with breast compression to help with latch. Mom hand expressing and getting few drops of colostrum. Advised to finger feed this back to baby with next feeding since baby fell asleep. Encouraged Mom to pre-pump to help with latch. Basic teaching reviewed, advised to continue to BF with feeding ques, 8-12 times or more in 24 hours. Discussed with Mom if she continues to struggle with latch we may need nipple shield. Mom to call for assist as needed. Lactation brochure left for review, advised of OP services and support group.   Maternal Data Has patient been taught Hand Expression?: Yes Does the patient have breastfeeding experience prior to this delivery?: No  Feeding Feeding Type: Breast Fed Length of feed: 15 min  LATCH Score/Interventions Latch: Repeated attempts needed to sustain latch, nipple held in mouth throughout feeding, stimulation needed to elicit sucking reflex. Intervention(s): Adjust position;Assist with latch;Breast massage;Breast compression  Audible Swallowing: A few with stimulation  Type of Nipple: Everted at rest and after stimulation (short shaft nipples bilateral)  Comfort (Breast/Nipple): Soft / non-tender     Hold (Positioning): Assistance needed to correctly position infant at breast and maintain latch. Intervention(s): Breastfeeding basics reviewed;Support Pillows;Position options;Skin to skin  LATCH Score: 7  Lactation Tools Discussed/Used Tools: Pump Breast pump type: Manual   Consult Status Consult  Status: Follow-up Date: 03/17/16 Follow-up type: In-patient    Katrine Coho 03/16/2016, 3:55 PM

## 2016-03-16 NOTE — Progress Notes (Signed)
MOB was referred for history of depression/anxiety. * Referral screened out by Clinical Social Worker because none of the following criteria appear to apply: ~ History of anxiety/depression during this pregnancy, or of post-partum depression. ~ Diagnosis of anxiety and/or depression within last 3 years OR * MOB's symptoms currently being treated with medication and/or therapy. Please contact the Clinical Social Worker if needs arise, or if MOB requests.  CSW completed chart review an MOB was dx with anxiety in January 2014.  Laurey Arrow, MSW, LCSW Clinical Social Work [phone]

## 2016-03-16 NOTE — Progress Notes (Signed)
Called Dr. Terri Piedra at [phone] to ask about antibiotic order. Per Dr. Terri Piedra administer Unasyn antibiotic if patient temperature is greater than or equal to 100.4 and if BP is greater than or equal to 160/100.

## 2016-03-16 NOTE — Progress Notes (Signed)
Post Partum Day 1 Subjective: no complaints, up ad lib, voiding, tolerating PO and nl lochia, pain controlled  Objective: Blood pressure (!) 90/50, pulse 79, temperature 97.7 F (36.5 C), resp. rate 18, height 5\' 1"  (1.549 m), weight 71.7 kg (158 lb), SpO2 100 %, unknown if currently breastfeeding.  Physical Exam:  General: alert and no distress Lochia: appropriate Uterine Fundus: firm  Recent Labs  03/15/16 0505 03/16/16 0316  HGB 12.9 9.1*  HCT 36.6 25.9*    Assessment/Plan: Plan for discharge tomorrow, Breastfeeding and Lactation consult.  Routine PP care.     LOS: 1 day   Bovard-Stuckert, Justan Gaede 03/16/2016, 7:32 AM

## 2016-03-16 NOTE — Progress Notes (Signed)
Dr. Melba Coon called to be updated on patient;s lochia status and BP of 100/62.  Udpated on patient being afebrile and that she wanted her IV out. Dr. Melba Coon ordered to DC IV and DC Unasyn. Will monitor.

## 2016-03-16 NOTE — Anesthesia Postprocedure Evaluation (Signed)
Anesthesia Post Note  Patient: Jasmine Bell  Procedure(s) Performed: * No procedures listed *  Patient location during evaluation: Mother Baby Anesthesia Type: Epidural Level of consciousness: awake Pain management: satisfactory to patient Vital Signs Assessment: post-procedure vital signs reviewed and stable Respiratory status: spontaneous breathing Cardiovascular status: stable Anesthetic complications: no     Last Vitals:  Vitals:   03/16/16 0353 03/16/16 1120  BP: (!) 90/50 100/62  Pulse: 79 76  Resp: 18 18  Temp: 36.5 C 36.7 C    Last Pain:  Vitals:   03/16/16 1120  TempSrc: Oral  PainSc:    Pain Goal: Patients Stated Pain Goal: 3 (03/15/16 0555)               Casimer Lanius

## 2016-03-17 LAB — RH IG WORKUP (INCLUDES ABO/RH)
ABO/RH(D): O NEG
FETAL SCREEN: NEGATIVE
Gestational Age(Wks): 38
Unit division: 0

## 2016-03-17 MED ORDER — OXYCODONE-ACETAMINOPHEN 5-325 MG PO TABS: 1.0000 | ORAL_TABLET | ORAL | 0 refills | Status: DC | PRN

## 2016-03-17 MED ORDER — IBUPROFEN 600 MG PO TABS: 600.0000 mg | ORAL_TABLET | Freq: Four times a day (QID) | ORAL | 0 refills | Status: DC

## 2016-03-17 NOTE — Progress Notes (Signed)
Post Partum Day 2 Subjective: no complaints, feeling much better today and pain controlled  Bleeding like a menses  Objective: Blood pressure (!) 100/56, pulse 69, temperature 98.4 F (36.9 C), temperature source Oral, resp. rate 18, height 5\' 1"  (1.549 m), weight 71.7 kg (158 lb), SpO2 99 %, unknown if currently breastfeeding.  Physical Exam:  General: alert and cooperative Lochia: appropriate Uterine Fundus: firm    Recent Labs  03/15/16 0505 03/16/16 0316  HGB 12.9 9.1*  HCT 36.6 25.9*    Assessment/Plan: Discharge home   LOS: 2 days   Jasmine Bell W 03/17/2016, 9:15 AM

## 2016-03-17 NOTE — Lactation Note (Signed)
This note was copied from a baby's chart. Lactation Consultation Note  Patient Name: Jasmine Bell [id number] Date: 03/17/2016 Reason for consult: Follow-up assessment  Mom reports that nursing is going well. So far, she has had success latching "Jasmine Bell" in [place] position. Dad is very helpful. Mom & Dad shown teacup hold to improve chances of infant latching with mom in other positions. Website ([email or web address].com) provided that discusses laid-back nursing in more detail.  Mom taught signs/sound of swallowing. Infant sleeping during consult (s/p circ).   Per maternal description, the areolar cyst removed from her R breast (in college) was an infected or plugged [place] gland.   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 Mom has our phone # for post-d/c questions.   Matthias Hughs [facility] 03/17/2016, 10:50 AM

## 2016-03-17 NOTE — Discharge Summary (Signed)
OB Discharge Summary     Patient Name: Jasmine Bell DOB: October 15, 1985 MRN: [id number]  Date of admission: 03/15/2016 Delivering MD: Carlynn Purl [facility]   Date of discharge: 03/17/2016  Admitting diagnosis: CTX Intrauterine pregnancy: [redacted]w[redacted]d     Secondary diagnosis:  Active Problems:   Normal labor and delivery   Vacuum extractor delivery, delivered  Additional problems:none     Discharge diagnosis: Term Pregnancy Delivered                                                                                                Post partum procedures:none  Augmentation: AROM and pitocin  Complications: None  Hospital course:  Onset of Labor With Vaginal Delivery  Vacuum assisted   30 y.o. yo G1P1001 at [redacted]w[redacted]d was admitted in Latent Labor on 03/15/2016. Patient had an uncomplicated labor course as follows:  Membrane Rupture Time/Date: 9:59 AM ,03/15/2016   Intrapartum Procedures: Episiotomy: Median [2]                                         Lacerations:  2nd degree [3]  Patient had a delivery of a Viable infant. 03/15/2016  Information for the patient's newborn:  Areanna, Stonier [id number]  Delivery Method: Vag-Spont    Pateint had an uncomplicated postpartum course.  She is ambulating, tolerating a regular diet, passing flatus, and urinating well. Patient is discharged home in stable condition on 03/17/16.    Physical exam Vitals:   03/16/16 0353 03/16/16 1120 03/16/16 1745 03/17/16 0500  BP: (!) 90/50 100/62 103/61 (!) 100/56  Pulse: 79 76 77 69  Resp: 18 18 18 18   Temp: 97.7 F (36.5 C) 98 F (36.7 C) 98.4 F (36.9 C) 98.4 F (36.9 C)  TempSrc:  Oral Oral Oral  SpO2:  99%    Weight:      Height:       General: alert and cooperative Lochia: appropriate Uterine Fundus: firm  Labs: Lab Results  Component Value Date   WBC 19.7 (H) 03/16/2016   HGB 9.1 (L) 03/16/2016   HCT 25.9 (L) 03/16/2016   MCV 92.5 03/16/2016   PLT 137 (L) 03/16/2016   CMP Latest Ref  Rng & Units 08/28/2013  Glucose 70 - 99 mg/dL 87  BUN 6 - 23 mg/dL 9  Creatinine 0.50 - 1.10 mg/dL 0.58  Sodium 135 - 145 mEq/L 139  Potassium 3.5 - 5.3 mEq/L 3.7  Chloride 96 - 112 mEq/L 104  CO2 19 - 32 mEq/L 26  Calcium 8.4 - 10.5 mg/dL 9.3  Total Protein 6.0 - 8.3 g/dL 7.1  Total Bilirubin 0.2 - 1.2 mg/dL 0.6  Alkaline Phos 39 - 117 U/L 49  AST 0 - 37 U/L 18  ALT 0 - 35 U/L 10    Discharge instruction: per After Visit Summary and "Baby and Me Booklet".  After visit meds:    Medication List    TAKE these medications   ibuprofen 600 MG tablet Commonly known as:  ADVIL,MOTRIN Take 1 tablet (600 mg total) by mouth every 6 (six) hours.   multivitamin-prenatal 27-0.8 MG Tabs tablet Take 1 tablet by mouth daily at 12 noon.   oxyCODONE-acetaminophen 5-325 MG tablet Commonly known as:  PERCOCET/ROXICET Take 1 tablet by mouth every 4 (four) hours as needed (for pain scale greater than or equal to 4 and less than 7).       Diet: routine diet  Activity: Advance as tolerated. Pelvic rest for 6 weeks.   Outpatient follow up:6 weeks Follow up Appt:No future appointments. Follow up Visit:No Follow-up on file.  Postpartum contraception: Undecided  Newborn Data: Live born female  Birth Weight: 7 lb 3.3 oz (3270 g) APGAR: 4, 9  Baby Feeding: Breast Disposition:home with mother   03/17/2016 Logan Bores, MD

## 2016-03-18 ENCOUNTER — Telehealth (HOSPITAL_COMMUNITY): Payer: Self-pay

## 2016-03-18 NOTE — Telephone Encounter (Signed)
Mom calls with questions regarding difficulty with latching to right breast.  Mom reports that breast is more firm. Lc ecnrouaged mom to massage, soften, pump breast prior to latch and pump when baby will not latch to establish a good supply.  Scheduled appointment for o/p on 9/21 at 1pm.

## 2016-03-19 ENCOUNTER — Telehealth (HOSPITAL_COMMUNITY): Payer: Self-pay | Admitting: Lactation Services

## 2016-03-19 LAB — TYPE AND SCREEN
ABO/RH(D): O NEG
Antibody Screen: POSITIVE
DAT, IGG: NEGATIVE
UNIT DIVISION: 0
Unit division: 0

## 2016-03-19 NOTE — Telephone Encounter (Signed)
Mom called with questions about whether she can dip the spoon into milk and reuse it, discussed that once spoon is in baby's mouth and dipped back into breast milk it is good for 1 hour. Mom reports she is working on engorgement and right breast is now softening some since this morning. She reports infant has been able to latch to the right side today. She does reports nipple is flat once infant comes off, enc her to keep trying to soften and to try and get a deeper latch with feeding. She reports infant has had 3-4 wet diapers and 2 stools today, stools are meconium. Enc mom to supplement infant with any EBM she has available. She reports his weight was down today at the [facility] visit.   Mom interested in OP appointment if one comes available. Message left for secretary to call mom if OP appt opens up. Enc mom to call back with questions/concerns prn.

## 2016-03-19 NOTE — Telephone Encounter (Signed)
Baby now 74 days old, Mom reports right breast is engorged and she cannot get milk moving. She pump/hand express and received only 5 ml of breast milk. Baby is nursing well on left breast. Advised Mom to get into warm shower, massage/hand express to get milk moving. Once milk is moving she can pump to empty breast, then apply ice packs. Next feeding try to get baby to latch onto the right breast. Mom reports baby has been having trouble latching to right breast before engorgement as well. If baby cannot latch, then continue to BF on left breast, pump right breast and give EBM back to baby. Continue to work with baby to latch on right breast as this may improve over time if she continues to offer breast and engorgement resolves. If above routine is not helping with engorgement, use cabbage leaves 1-2 times per day. Be sure to BF and/or pump to empty breast every 2-3 hours. Apply ice packs. Advised of OP services, she will call. Advised of support group on Monday evening and Tuesday morning.

## 2016-03-24 ENCOUNTER — Ambulatory Visit (HOSPITAL_COMMUNITY)
Admission: RE | Admit: 2016-03-24 | Discharge: 2016-03-24 | Disposition: A | Discharge status: Home / Self Care | Payer: 59 | Source: Ambulatory Visit | Attending: Obstetrics and Gynecology | Admitting: Obstetrics and Gynecology

## 2016-03-24 NOTE — Lactation Note (Signed)
Lactation Consult for Jasmine Bell (mother) and Jasmine Bell (DOB: [birth year])  Mother's reason for visit: difficulty latching on R breast Consult:  Initial Lactation Consultant:  Jasmine Bell  ________________________________________________________________________ BW: [id number] (7# 3.3oz) Nadir: 6# 9oz 9-18: 6# 65oz Today's weight: 7# 0.2oz  Mother's Name: Jasmine Bell Type of delivery:  Vag Breastfeeding Experience: primip Maternal Medical Conditions:  None Maternal Medications: PNV  ________________________________________________________________________  Breastfeeding History (Post Discharge)  Frequency of breastfeeding: q3h Duration of feeding: 15 min     Infant Intake and Output Assessment  Voids: multiple 24 hrs.  Color:  Clear yellow Stools: multiple in 24 hrs.  Color:  Yellow  ________________________________________________________________________  Maternal Breast Assessment  Breast:  Full Nipple:  Erect  _______________________________________________________________________ Feeding Assessment/Evaluation  Initial feeding assessment:  Infant's oral assessment:  WNL  Suck assessment:  Displays both  Pre-feed weight: 3182 g   Post-feed weight: 3198 g  Amount transferred: 16 ml L breast, 30min. Taken off breast so wouldn't get too full  Pre-feed weight: 3198 g   Post-feed weight: 3216 g  Amount transferred: 18 ml 14 min, R breast,   Pre-feed weight: 3216 g   Post-feed weight: 3226 Amount transferred: 10 ml R breast, 66min  Total amount transferred:  44 ml (infant had fed at breast 1 hr before consult)  "Jasmine Bell" is 75 days old & is 2.6% below BW. He has gained 3 oz over the last 3 days. He is exclusively breast fed. Mom presents b/c of difficulty latching Jasmine Bell onto her R side. Mom says last good feeding on R side was 2 days ago. Mom's nipples are intact w/some pinkness on tips of nipples noticed.   Jasmine Bell would not latch  onto R side w/Mom in a laid-back position. To prevent fussiness, we immediately latched him to the L side in the reclining/cross-cradle hold & he latched w/ease. It is possible that [facility] favors having his R shoulder facing downward when nursing. When we placed him in football hold on the R breast, he latched w/ease & fed from that breast twice.  Mom admits to doing the "Mom on-call" program where infants are fed q3h, instead of with cues. Mom is getting up during the night to wake him q3hrs. Considering his good weight gain, I encouraged Mom to let him have a 4-5 hr stretch of sleep at night so that she can get more sleep. In addition, Jasmine Bell may be a more efficient eater if he is truly ready to be fed.  Elinor Dodge, RN, IBCLC

## 2016-04-11 ENCOUNTER — Inpatient Hospital Stay (HOSPITAL_COMMUNITY)
Admission: AD | Admit: 2016-04-11 | Discharge: 2016-04-11 | Disposition: A | Discharge status: Home / Self Care | Payer: 59 | Source: Ambulatory Visit | Attending: Obstetrics and Gynecology | Admitting: Obstetrics and Gynecology

## 2016-04-11 ENCOUNTER — Encounter (HOSPITAL_COMMUNITY): Payer: Self-pay | Admitting: *Deleted

## 2016-04-11 ENCOUNTER — Inpatient Hospital Stay (HOSPITAL_COMMUNITY): Payer: 59

## 2016-04-11 DIAGNOSIS — N939 Abnormal uterine and vaginal bleeding, unspecified: Secondary | ICD-10-CM | POA: Diagnosis not present

## 2016-04-11 LAB — CBC
HCT: 34.5 % — ABNORMAL LOW (ref 36.0–46.0)
HEMOGLOBIN: 11.4 g/dL — AB (ref 12.0–15.0)
MCH: 30.6 pg (ref 26.0–34.0)
MCHC: 33 g/dL (ref 30.0–36.0)
MCV: 92.5 fL (ref 78.0–100.0)
Platelets: 243 10*3/uL (ref 150–400)
RBC: 3.73 MIL/uL — ABNORMAL LOW (ref 3.87–5.11)
RDW: 12.6 % (ref 11.5–15.5)
WBC: 6.1 10*3/uL (ref 4.0–10.5)

## 2016-04-11 MED ORDER — OXYCODONE-ACETAMINOPHEN 5-325 MG PO TABS: 1.0000 | ORAL_TABLET | Freq: Four times a day (QID) | ORAL | 0 refills | Status: DC | PRN

## 2016-04-11 MED ORDER — MISOPROSTOL 200 MCG PO TABS
800.0000 ug | ORAL_TABLET | Freq: Once | ORAL | Status: AC
  Administered 2016-04-11: 800 ug via VAGINAL
  Filled 2016-04-11: qty 4

## 2016-04-11 NOTE — Discharge Instructions (Signed)
Return to care   If you have heavier bleeding that soaks through more that 2 pads per hour for an hour or more  If you bleed so much that you feel like you might pass out or you do pass out  If you have significant abdominal pain that is not improved with Tylenol   If you develop a fever > 100.5     Postpartum Hemorrhage Postpartum hemorrhage is excessive blood loss after childbirth. Some blood loss is normal after delivering a baby. However, postpartum hemorrhage is a potentially serious condition.  CAUSES   A loss of muscle tone in the uterus after childbirth.  Failure to deliver all of the placenta.  Wounds in the birth canal caused by delivery of the fetus.  A maternal bleeding disorder that prevents blood clotting (rare). RISK FACTORS You are at greater risk for postpartum hemorrhage if you:  Have a history of postpartum hemorrhage.  Have delivered more than one baby.  Had preeclampsia or eclampsia.  Had problems with the placenta.  Had complications during your labor or delivery.  Are obese.  Are Asian or Hispanic. SIGNS AND SYMPTOMS  Vaginal bleeding after delivery is normal and should be expected. Bleeding (lochia) will occur for several days after childbirth. This can be expected with normal vaginal deliveries and cesarean deliveries.  You are bleeding too much after your delivery if you are:  Passing large clots or pieces of tissue. This may be small pieces of placenta left after delivery.   Soaking more than one sanitary pad per hour for several hours.   Having heavy, bright-red bleeding that occurs 4 days or more after delivery.   Having a discharge that has a bad smell or if you begin to run an unexplained fever.   Having times of lightheadedness or fainting, feeling short of breath, or having your heart beat fast with very little activity.  DIAGNOSIS  A diagnosis is based on your symptoms and a physical exam of your perineum, vagina, cervix,  and uterus. Diagnostic tests may include:  Blood pressure and pulse.  Blood tests.  Blood clotting tests.  Ultrasonography. TREATMENT  Treatment is based on the severity of bleeding and may include:  Uterine massage.  Medicines.  Blood transfusions.  Sometimes bleeding occurs if portions of the placenta are left behind in the uterus after delivery. If this happens, often a curettage or scraping of the inside of the uterus must be done. This usually stops the bleeding. If this treatment does not stop the bleeding, surgery (hysterectomy) may have to be performed to remove the uterus.  If bleeding is due to clotting or bleeding problems that are not related to the pregnancy, other treatments may be needed.  HOME CARE INSTRUCTIONS   Limit your activity as directed by your health care provider.Your health care provider may order bed rest (getting up to the bathroom only) or may allow you to continue light activity.   Keep track of the number of pads you use each day and how soaked (saturated) they are. Write this number down.   Do not use tampons. Do not douche or have sexual intercourse until approved by your health care provider.   Drink enough fluids to keep your urine clear or pale yellow.   Get proper amounts of rest.   Eat foods that are rich in iron, such as spinach, red meat, and legumes.  SEEK IMMEDIATE MEDICAL CARE IF:  You experience severe cramps in your stomach, back, or belly (abdomen).  You have a fever.   You pass large clots or tissue. Save any tissue for your health care provider to look at.   Your bleeding increases.  You become weak or lightheaded, or you pass out.   Your sanitary pad count per hour is increasing. MAKE SURE YOU:  Understand these instructions.  Will watch your condition.  Will get help right away if you are not doing well or get worse.   This information is not intended to replace advice given to you by your health  care provider. Make sure you discuss any questions you have with your health care provider.   Document Released: 09/10/2003 Document Revised: 06/25/2013 Document Reviewed: 12/06/2012 Elsevier Interactive Patient Education [employer].

## 2016-04-11 NOTE — MAU Note (Signed)
Pt presents to MAU with complaints of heavy vaginal bleeding that started 30 mins ago. Pt delivered vaginally on September the 12th. Denies pain.

## 2016-04-11 NOTE — MAU Provider Note (Signed)
History     CSN: [id number]  Arrival date and time: 04/11/16 1526   First Provider Initiated Contact with Patient 04/11/16 1641       Chief Complaint  Patient presents with  . Vaginal Bleeding   HPI Jasmine Bell is a 30 y.o. G1P1001 who presents 1 month s/p SVD for vaginal bleeding. States she has had menstrual like bleeding daily since delivery that increased this afternoon just prior to arrival. Reports large gush of blood this afternoon that she estimates to be about 300 ml and caused her to leave a trail of blood going up the stairs of her home. Denies abdominal pain, fever/chills, headache, dizziness, or palpitations. No intercourse since delivery.   OB History    Gravida Para Term Preterm AB Living   1 1 1     1    SAB TAB Ectopic Multiple Live Births         0 1      Past Medical History:  Diagnosis Date  . Anxiety     Past Surgical History:  Procedure Laterality Date  . aerolar cyst on breast removed    . BREAST SURGERY     areolar cyst removal    Family History  Problem Relation Age of Onset  . Depression Father   . Breast cancer Maternal Aunt   . Diabetes Paternal Aunt   . Testicular cancer Maternal Grandfather   . Stroke Maternal Grandfather   . Bipolar disorder Paternal Grandfather     Social History  Substance Use Topics  . Smoking status: Never Smoker  . Smokeless tobacco: Never Used  . Alcohol use No    Allergies: No Known Allergies  Prescriptions Prior to Admission  Medication Sig Dispense Refill Last Dose  . ibuprofen (ADVIL,MOTRIN) 600 MG tablet Take 1 tablet (600 mg total) by mouth every 6 (six) hours. 30 tablet 0   . oxyCODONE-acetaminophen (PERCOCET/ROXICET) 5-325 MG tablet Take 1 tablet by mouth every 4 (four) hours as needed (for pain scale greater than or equal to 4 and less than 7). 30 tablet 0   . Prenatal Vit-Fe Fumarate-FA (MULTIVITAMIN-PRENATAL) 27-0.8 MG TABS tablet Take 1 tablet by mouth daily at 12 noon.   03/14/2016 at  Unknown time    Review of Systems  Constitutional: Negative.   HENT: Negative.   Cardiovascular: Negative.   Gastrointestinal: Negative.   Genitourinary:       + vaginal bleeding   Physical Exam   Blood pressure 102/69, pulse 93, temperature 97.5 F (36.4 C), resp. rate 16, SpO2 100 %, unknown if currently breastfeeding.  Physical Exam  Nursing note and vitals reviewed. Constitutional: She is oriented to person, place, and time. She appears well-developed and well-nourished. No distress.  HENT:  Head: Normocephalic and atraumatic.  Eyes: Conjunctivae are normal. Right eye exhibits no discharge. Left eye exhibits no discharge. No scleral icterus.  Neck: Normal range of motion.  Cardiovascular: Normal rate, regular rhythm and normal heart sounds.   No murmur heard. Respiratory: Effort normal and breath sounds normal. No respiratory distress. She has no wheezes.  GI: Soft. Bowel sounds are normal. She exhibits no distension. There is no tenderness.  Genitourinary: Uterus is enlarged. Uterus is not tender.  Genitourinary Comments: Small amount of dark red blood trickling from cervical os.  Cervix dilated 1 cm.   Neurological: She is alert and oriented to person, place, and time.  Skin: Skin is warm and dry. She is not diaphoretic.  Psychiatric: She has a  normal mood and affect. Her behavior is normal. Judgment and thought content normal.    MAU Course  Procedures Results for orders placed or performed during the hospital encounter of 04/11/16 (from the past 24 hour(s))  CBC     Status: Abnormal   Collection Time: 04/11/16  5:07 PM  Result Value Ref Range   WBC 6.1 4.0 - 10.5 K/uL   RBC 3.73 (L) 3.87 - 5.11 MIL/uL   Hemoglobin 11.4 (L) 12.0 - 15.0 g/dL   HCT 34.5 (L) 36.0 - 46.0 %   MCV 92.5 78.0 - 100.0 fL   MCH 30.6 26.0 - 34.0 pg   MCHC 33.0 30.0 - 36.0 g/dL   RDW 12.6 11.5 - 15.5 %   Platelets 243 150 - 400 K/uL   US Pelvis Complete  Result Date:  04/11/2016 CLINICAL DATA:  Postpartum bleeding.  Vaginal delivery 03/15/2016. EXAM: TRANSABDOMINAL ULTRASOUND OF PELVIS TECHNIQUE: Transabdominal ultrasound examination of the pelvis was performed including evaluation of the uterus, ovaries, adnexal regions, and pelvic cul-de-sac. COMPARISON:  None. FINDINGS: Uterus Measurements: 9.8 x 6.2 x 7.1 cm. No fibroids or other mass visualized. Endometrium Demonstrates heterogeneous hypervascular material within. Maximally 2.1 cm. Right ovary Measurements: 3.1 x 1.2 x 1.9 cm. Normal appearance/no adnexal mass. Left ovary Measurements: 3.7 x 1.8 x 2.7 cm. Normal appearance/no adnexal mass. Other findings:  Trace free pelvic fluid is likely physiologic. IMPRESSION: Heterogeneous hypervascular material within the endometrium, highly suspicious for retained products of conception. Electronically Signed   By: Abigail Miyamoto M.D.   On: 04/11/2016 19:34    MDM VSS CBC -- hemoglobin 11.4, WBC 6.1 Per Dr. Willis Modena, ultrasound ordered Ultrasound shows hypervascular area suspicious for retained POC Discussed results with Dr. Willis Modena; offered pt cytotec vs D&C -- pt prefers cytotec Cytotec 800 mcg PV in MAU  Assessment and Plan  A: 1. Retained products of conception, postpartum   2. Postpartum bleeding    P: Discharge home Rx percocet  Call office in the morning to update with symptoms and manage follow up Discussed bleeding precautions and reasons to return to [place] 04/11/2016, 4:37 PM

## 2016-04-12 ENCOUNTER — Inpatient Hospital Stay (HOSPITAL_COMMUNITY)
Admission: AD | Admit: 2016-04-12 | Discharge: 2016-04-12 | Disposition: A | Discharge status: Home / Self Care | Payer: 59 | Source: Ambulatory Visit | Attending: Obstetrics and Gynecology | Admitting: Obstetrics and Gynecology

## 2016-04-12 ENCOUNTER — Encounter (HOSPITAL_COMMUNITY)
Admission: AD | Disposition: A | Discharge status: Home / Self Care | Payer: Self-pay | Source: Ambulatory Visit | Attending: Obstetrics and Gynecology

## 2016-04-12 ENCOUNTER — Inpatient Hospital Stay (HOSPITAL_COMMUNITY): Payer: 59 | Admitting: Anesthesiology

## 2016-04-12 ENCOUNTER — Encounter (HOSPITAL_COMMUNITY): Payer: Self-pay

## 2016-04-12 DIAGNOSIS — Z79899 Other long term (current) drug therapy: Secondary | ICD-10-CM | POA: Insufficient documentation

## 2016-04-12 HISTORY — PX: DILATION AND EVACUATION: SHX1459

## 2016-04-12 LAB — CBC
HEMATOCRIT: 34.3 % — AB (ref 36.0–46.0)
Hemoglobin: 11.4 g/dL — ABNORMAL LOW (ref 12.0–15.0)
MCH: 31.2 pg (ref 26.0–34.0)
MCHC: 33.2 g/dL (ref 30.0–36.0)
MCV: 94 fL (ref 78.0–100.0)
PLATELETS: 266 10*3/uL (ref 150–400)
RBC: 3.65 MIL/uL — ABNORMAL LOW (ref 3.87–5.11)
RDW: 12.1 % (ref 11.5–15.5)
WBC: 7.7 10*3/uL (ref 4.0–10.5)

## 2016-04-12 SURGERY — DILATION AND EVACUATION, UTERUS
Anesthesia: Monitor Anesthesia Care | Site: Vagina

## 2016-04-12 MED ORDER — METOCLOPRAMIDE HCL 5 MG/ML IJ SOLN: 10.0000 mg | Freq: Once | INTRAMUSCULAR | Status: DC | PRN

## 2016-04-12 MED ORDER — PROPOFOL 500 MG/50ML IV EMUL
INTRAVENOUS | Status: DC | PRN
  Administered 2016-04-12 (×6): 50 mg via INTRAVENOUS

## 2016-04-12 MED ORDER — HYDROCODONE-ACETAMINOPHEN 7.5-325 MG PO TABS: 1.0000 | ORAL_TABLET | Freq: Once | ORAL | Status: DC | PRN

## 2016-04-12 MED ORDER — ONDANSETRON HCL 4 MG/2ML IJ SOLN
INTRAMUSCULAR | Status: DC | PRN
  Administered 2016-04-12: 4 mg via INTRAVENOUS

## 2016-04-12 MED ORDER — SOD CITRATE-CITRIC ACID 500-334 MG/5ML PO SOLN
30.0000 mL | Freq: Once | ORAL | Status: AC
  Administered 2016-04-12: 30 mL via ORAL
  Filled 2016-04-12: qty 15

## 2016-04-12 MED ORDER — MEPERIDINE HCL 25 MG/ML IJ SOLN: 6.2500 mg | INTRAMUSCULAR | Status: DC | PRN

## 2016-04-12 MED ORDER — OXYCODONE-ACETAMINOPHEN 5-325 MG PO TABS
1.0000 | ORAL_TABLET | Freq: Once | ORAL | Status: AC
  Administered 2016-04-12: 1 via ORAL

## 2016-04-12 MED ORDER — LIDOCAINE HCL (CARDIAC) 20 MG/ML IV SOLN
INTRAVENOUS | Status: DC | PRN
  Administered 2016-04-12: 30 mg via INTRAVENOUS

## 2016-04-12 MED ORDER — OXYCODONE-ACETAMINOPHEN 5-325 MG PO TABS
ORAL_TABLET | ORAL | Status: AC
  Administered 2016-04-12: 1 via ORAL
  Filled 2016-04-12: qty 1

## 2016-04-12 MED ORDER — FENTANYL CITRATE (PF) 100 MCG/2ML IJ SOLN
INTRAMUSCULAR | Status: DC | PRN
  Administered 2016-04-12: 100 ug via INTRAVENOUS

## 2016-04-12 MED ORDER — LACTATED RINGERS IV SOLN
INTRAVENOUS | Status: DC
  Administered 2016-04-12 (×2): via INTRAVENOUS

## 2016-04-12 MED ORDER — FENTANYL CITRATE (PF) 100 MCG/2ML IJ SOLN: 25.0000 ug | INTRAMUSCULAR | Status: DC | PRN

## 2016-04-12 MED ORDER — LIDOCAINE HCL 2 % IJ SOLN
INTRAMUSCULAR | Status: DC | PRN
  Administered 2016-04-12: 16 mL

## 2016-04-12 SURGICAL SUPPLY — 16 items
CATH ROBINSON RED A/P 16FR (CATHETERS) ×4 IMPLANT
CLOTH BEACON ORANGE TIMEOUT ST (SAFETY) ×4 IMPLANT
CONTAINER PREFILL 10% NBF 60ML (FORM) IMPLANT
GLOVE BIO SURGEON STRL SZ8 (GLOVE) ×4 IMPLANT
GLOVE BIOGEL PI IND STRL 7.0 (GLOVE) ×2 IMPLANT
GLOVE BIOGEL PI INDICATOR 7.0 (GLOVE) ×2
GLOVE ORTHO TXT STRL SZ7.5 (GLOVE) ×4 IMPLANT
GOWN STRL REUS W/TWL LRG LVL3 (GOWN DISPOSABLE) ×8 IMPLANT
KIT BERKELEY 1ST TRIMESTER 3/8 (MISCELLANEOUS) ×4 IMPLANT
PACK VAGINAL MINOR WOMEN LF (CUSTOM PROCEDURE TRAY) ×4 IMPLANT
PAD OB MATERNITY 4.3X12.25 (PERSONAL CARE ITEMS) ×4 IMPLANT
PAD PREP 24X48 CUFFED NSTRL (MISCELLANEOUS) ×4 IMPLANT
SET BERKELEY SUCTION TUBING (SUCTIONS) ×4 IMPLANT
TOP DISP BERKELEY (MISCELLANEOUS) ×4 IMPLANT
TOWEL OR 17X24 6PK STRL BLUE (TOWEL DISPOSABLE) ×8 IMPLANT
VACURETTE 10 RIGID CVD (CANNULA) ×4 IMPLANT

## 2016-04-12 NOTE — Transfer of Care (Signed)
Immediate Anesthesia Transfer of Care Note  Patient: Jasmine Bell  Procedure(s) Performed: Procedure(s): DILATATION AND EVACUATION  Patient Location: PACU  Anesthesia Type:MAC  Level of Consciousness: awake, alert , oriented and patient cooperative  Airway & Oxygen Therapy: Patient Spontanous Breathing and Patient connected to nasal cannula oxygen  Post-op Assessment: Report given to RN, Post -op Vital signs reviewed and stable and Patient moving all extremities X 4  Post vital signs: Reviewed and stable  Last Vitals:  Vitals:   04/12/16 0030  BP: 101/70  Pulse: 99  Resp: 17  Temp: 36.5 C    Last Pain:  Vitals:   04/12/16 0030  TempSrc: Oral         Complications: No apparent anesthesia complications

## 2016-04-12 NOTE — MAU Provider Note (Signed)
HPI: Jasmine Bell is a 30 y.o. year old G38P1001 female at 1 month postpartum VAVD who presents to MAU reporting increased bleeding and passing clots after being seen in MAU earlier this evening for retained POC's. Bleeding and VS stable. Dr. Willis Modena plans to perform D&C. Prep OR.  Manya Silvas, CNM 04/12/2016 1:03 AM

## 2016-04-12 NOTE — MAU Note (Signed)
Pt returns after earlier visit and having cytotec placed. Pt states the cytotec came out and she started having heavy bleeding and passing clots. Denies pain.

## 2016-04-12 NOTE — Discharge Instructions (Signed)
Dilation and Curettage or Vacuum Curettage Dilation and curettage (D&C) and vacuum curettage are minor procedures. A D&C involves stretching (dilation) the cervix and scraping (curettage) the inside lining of the womb (uterus). During a D&C, tissue is gently scraped from the inside lining of the uterus. During a vacuum curettage, the lining and tissue in the uterus are removed with the use of gentle suction.  Curettage may be performed to either diagnose or treat a problem. As a diagnostic procedure, curettage is performed to examine tissues from the uterus. A diagnostic curettage may be performed for the following symptoms:   Irregular bleeding in the uterus.   Bleeding with the development of clots.   Spotting between menstrual periods.   Prolonged menstrual periods.   Bleeding after menopause.   No menstrual period (amenorrhea).   A change in size and shape of the uterus.  As a treatment procedure, curettage may be performed for the following reasons:   Removal of an IUD (intrauterine device).   Removal of retained placenta after giving birth. Retained placenta can cause an infection or bleeding severe enough to require transfusions.   Abortion.   Miscarriage.   Removal of polyps inside the uterus.   Removal of uncommon types of noncancerous lumps (fibroids).  LET [facility] CARE PROVIDER KNOW ABOUT:   Any allergies you have.   All medicines you are taking, including vitamins, herbs, eye drops, creams, and over-the-counter medicines.   Previous problems you or members of your family have had with the use of anesthetics.   Any blood disorders you have.   Previous surgeries you have had.   Medical conditions you have. RISKS AND COMPLICATIONS  Generally, this is a safe procedure. However, as with any procedure, complications can occur. Possible complications include:  Excessive bleeding.   Infection of the uterus.   Damage to the cervix.    Development of scar tissue (adhesions) inside the uterus, later causing abnormal amounts of menstrual bleeding.   Complications from the general anesthetic, if a general anesthetic is used.   Putting a hole (perforation) in the uterus. This is rare.  BEFORE THE PROCEDURE   Eat and drink before the procedure only as directed by your health care provider.   Arrange for someone to take you home.  PROCEDURE  This procedure usually takes about 15-30 minutes.  You will be given one of the following:  A medicine that numbs the area in and around the cervix (local anesthetic).   A medicine to make you sleep through the procedure (general anesthetic).  You will lie on your back with your legs in stirrups.   A warm metal or plastic instrument (speculum) will be placed in your vagina to keep it open and to allow the health care provider to see the cervix.  There are two ways in which your cervix can be softened and dilated. These include:   Taking a medicine.   Having thin rods (laminaria) inserted into your cervix.   A curved tool (curette) will be used to scrape cells from the inside lining of the uterus. In some cases, gentle suction is applied with the curette. The curette will then be removed.  AFTER THE PROCEDURE   You will rest in the recovery area until you are stable and are ready to go home.   You may feel sick to your stomach (nauseous) or throw up (vomit) if you were given a general anesthetic.   You may have a sore throat if a tube  was placed in your throat during general anesthesia.   You may have light cramping and bleeding. This may last for 2 days to 2 weeks after the procedure.   Your uterus needs to make a new lining after the procedure. This may make your next period late.   This information is not intended to replace advice given to you by your health care provider. Make sure you discuss any questions you have with your health care provider.    Document Released: 06/20/2005 Document Revised: 02/20/2013 Document Reviewed: 01/17/2013 Elsevier Interactive Patient Education [employer].

## 2016-04-12 NOTE — Anesthesia Preprocedure Evaluation (Addendum)
Anesthesia Evaluation  Patient identified by MRN, date of birth, ID band Patient awake    Reviewed: Allergy & Precautions, NPO status , Patient's Chart, lab work & pertinent test results  Airway Mallampati: I       Dental no notable dental hx. (+) Teeth Intact, Caps,    Pulmonary neg pulmonary ROS,    breath sounds clear to auscultation       Cardiovascular negative cardio ROS Normal cardiovascular exam Rhythm:Regular Rate:Normal     Neuro/Psych PSYCHIATRIC DISORDERS Anxiety negative neurological ROS     GI/Hepatic negative GI ROS, Neg liver ROS,   Endo/Other  negative endocrine ROS  Renal/GU negative Renal ROS  negative genitourinary   Musculoskeletal negative musculoskeletal ROS (+)   Abdominal   Peds  Hematology negative hematology ROS (+)   Anesthesia Other Findings   Reproductive/Obstetrics Retained POC                            Anesthesia Physical Anesthesia Plan  ASA: I  Anesthesia Plan: MAC   Post-op Pain Management:    Induction: Intravenous  Airway Management Planned: Natural Airway  Additional Equipment:   Intra-op Plan:   Post-operative Plan:   Informed Consent: I have reviewed the patients History and Physical, chart, labs and discussed the procedure including the risks, benefits and alternatives for the proposed anesthesia with the patient or authorized representative who has indicated his/her understanding and acceptance.     Plan Discussed with: Anesthesiologist, CRNA and Surgeon  Anesthesia Plan Comments:         Anesthesia Quick Evaluation

## 2016-04-12 NOTE — H&P (Signed)
Jasmine Bell is an 30 y.o. female. She is one month s/p VAVD, presents with increased vaginal bleeding with large clots.  Seen yesterday pm for this, u/s confirms probable retained POC, she chose to try vaginal cytotec.  When she got home, she had more bleeding and passed cytotec pills as well so now returns for D&E.  Pertinent Gynecological History: OB History: G1, P1001   Menstrual History: No LMP recorded.    Past Medical History:  Diagnosis Date  . Anxiety     Past Surgical History:  Procedure Laterality Date  . aerolar cyst on breast removed    . BREAST SURGERY     areolar cyst removal    Family History  Problem Relation Age of Onset  . Depression Father   . Breast cancer Maternal Aunt   . Diabetes Paternal Aunt   . Testicular cancer Maternal Grandfather   . Stroke Maternal Grandfather   . Bipolar disorder Paternal Grandfather     Social History:  reports that she has never smoked. She has never used smokeless tobacco. She reports that she does not drink alcohol or use drugs.  Allergies: No Known Allergies  Prescriptions Prior to Admission  Medication Sig Dispense Refill Last Dose  . ibuprofen (ADVIL,MOTRIN) 600 MG tablet Take 1 tablet (600 mg total) by mouth every 6 (six) hours. 30 tablet 0   . oxyCODONE-acetaminophen (PERCOCET/ROXICET) 5-325 MG tablet Take 1-2 tablets by mouth every 6 (six) hours as needed. 10 tablet 0   . Prenatal Vit-Fe Fumarate-FA (MULTIVITAMIN-PRENATAL) 27-0.8 MG TABS tablet Take 1 tablet by mouth daily at 12 noon.   03/14/2016 at Unknown time    Review of Systems  Respiratory: Negative.   Cardiovascular: Negative.     Blood pressure 101/70, pulse 99, temperature 97.7 F (36.5 C), temperature source Oral, resp. rate 17, SpO2 100 %, unknown if currently breastfeeding. Physical Exam  Vitals reviewed. Constitutional: She appears well-developed and well-nourished.  Cardiovascular: Normal rate.   Respiratory: Effort normal. No  respiratory distress.  GI: Soft.    Results for orders placed or performed during the hospital encounter of 04/12/16 (from the past 24 hour(s))  CBC     Status: Abnormal   Collection Time: 04/12/16 12:55 AM  Result Value Ref Range   WBC 7.7 4.0 - 10.5 K/uL   RBC 3.65 (L) 3.87 - 5.11 MIL/uL   Hemoglobin 11.4 (L) 12.0 - 15.0 g/dL   HCT 34.3 (L) 36.0 - 46.0 %   MCV 94.0 78.0 - 100.0 fL   MCH 31.2 26.0 - 34.0 pg   MCHC 33.2 30.0 - 36.0 g/dL   RDW 12.1 11.5 - 15.5 %   Platelets 266 150 - 400 K/uL    US Pelvis Complete  Result Date: 04/11/2016 CLINICAL DATA:  Postpartum bleeding.  Vaginal delivery 03/15/2016. EXAM: TRANSABDOMINAL ULTRASOUND OF PELVIS TECHNIQUE: Transabdominal ultrasound examination of the pelvis was performed including evaluation of the uterus, ovaries, adnexal regions, and pelvic cul-de-sac. COMPARISON:  None. FINDINGS: Uterus Measurements: 9.8 x 6.2 x 7.1 cm. No fibroids or other mass visualized. Endometrium Demonstrates heterogeneous hypervascular material within. Maximally 2.1 cm. Right ovary Measurements: 3.1 x 1.2 x 1.9 cm. Normal appearance/no adnexal mass. Left ovary Measurements: 3.7 x 1.8 x 2.7 cm. Normal appearance/no adnexal mass. Other findings:  Trace free pelvic fluid is likely physiologic. IMPRESSION: Heterogeneous hypervascular material within the endometrium, highly suspicious for retained products of conception. Electronically Signed   By: Abigail Miyamoto M.D.   On: 04/11/2016 19:34  Assessment/Plan: Retained POC one month s/p VAVD, vailed attempt at using vaginal cytotec.  Discussed surgical procedure and risks, will proceed to OR for D&E.  Richards Pherigo D 04/12/2016, 1:31 AM

## 2016-04-12 NOTE — Anesthesia Postprocedure Evaluation (Signed)
Anesthesia Post Note  Patient: Jasmine Bell  Procedure(s) Performed: Procedure(s): DILATATION AND EVACUATION  Patient location during evaluation: PACU Anesthesia Type: MAC Level of consciousness: awake and alert and oriented Pain management: pain level controlled Vital Signs Assessment: post-procedure vital signs reviewed and stable Respiratory status: spontaneous breathing, nonlabored ventilation and respiratory function stable Cardiovascular status: stable and blood pressure returned to baseline Postop Assessment: no signs of nausea or vomiting Anesthetic complications: no     Last Vitals:  Vitals:   04/12/16 0215 04/12/16 0230  BP: (!) 97/48 (!) 91/46  Pulse: 90 94  Resp: 19 18  Temp:      Last Pain:  Vitals:   04/12/16 0030  TempSrc: Oral   Pain Goal:                 Elmus Mathes A.

## 2016-04-12 NOTE — Op Note (Signed)
  Preoperative Diagnosis: Retained POC Postop Diagnosis:  Same Procedure:  D&E Anesthesia:  MAC, paracervical block Findings: Cervix was 1 cm dilated with palpable POC in endometrium, uterus was 10 weeks size, abundant products of conception were obtained Specimens: Products of conception sent for routine pathology Estimated blood loss: Minimal Complications: None  Procedure in detail: The patient was taken to the operating room and placed in the dorsosupine position. IV sedation was given and she was placed and mobile stirrups. Perineum and vagina were prepped and draped in the usual sterile fashion, bladder drained with a red Robinson catheter. A Graves speculum was inserted in the vagina. The anterior lip of the cervix was grasped with a single-tooth tenaculum. Paracervical block was then performed with a total of 16 cc of 2% plain lidocaine. Uterus then sounded to 10 cm. Cervix was gradually easily dilated to size 29 dilator. A size 10 curved suction curet was then inserted without difficulty. Suction curettage was return was performed with return of abundant products of conception. Sharp curettage was performed with which revealed some remaining tissue posteriorly. Suction curettage was performed again with return of more POC. Sharp curettage then revealed good uterine cry in all quadrants.  Suction curettage was performed one last time with minimal blood.  The single-tooth tenaculum was removed from the cervix and bleeding was controlled with pressure. All instruments were removed from the vagina. The patient was taken to the recovery in stable condition after tolerating the procedure well. Counts were correct, she received doxycycline at the beginning of the procedure and had PAS hose on throughout the procedure.

## 2016-04-14 ENCOUNTER — Encounter (HOSPITAL_COMMUNITY): Payer: Self-pay | Admitting: Obstetrics and Gynecology

## 2016-04-16 LAB — TYPE AND SCREEN
ABO/RH(D): O NEG
ANTIBODY SCREEN: POSITIVE
DAT, IgG: NEGATIVE
UNIT DIVISION: 0
Unit division: 0

## 2016-04-26 DIAGNOSIS — Z1389 Encounter for screening for other disorder: Secondary | ICD-10-CM | POA: Diagnosis not present

## 2016-04-26 DIAGNOSIS — Z3009 Encounter for other general counseling and advice on contraception: Secondary | ICD-10-CM | POA: Diagnosis not present

## 2016-07-13 DIAGNOSIS — J111 Influenza due to unidentified influenza virus with other respiratory manifestations: Secondary | ICD-10-CM | POA: Diagnosis not present

## 2016-07-13 DIAGNOSIS — J00 Acute nasopharyngitis [common cold]: Secondary | ICD-10-CM | POA: Diagnosis not present

## 2016-07-14 DIAGNOSIS — J029 Acute pharyngitis, unspecified: Secondary | ICD-10-CM | POA: Diagnosis not present

## 2017-04-10 DIAGNOSIS — Z3201 Encounter for pregnancy test, result positive: Secondary | ICD-10-CM | POA: Diagnosis not present

## 2017-05-16 NOTE — H&P (Signed)
Jasmine Bell is an 31 y.o. female G2P1001 with a finding of a missed miscarriage at [redacted] weeks GA found on [place] for OB w/u.  Pt denies bleeding or pain.  After options reviewed elected a suction dilation and evacuation.  Pertinent Gynecological History: OB History:  2017 38 week vacuum assisted delivery/retained placenta   Menstrual History:  No LMP recorded.    Past Medical History:  Diagnosis Date  . Anxiety     Past Surgical History:  Procedure Laterality Date  . aerolar cyst on breast removed    . BREAST SURGERY     areolar cyst removal    Family History  Problem Relation Age of Onset  . Depression Father   . Breast cancer Maternal Aunt   . Diabetes Paternal Aunt   . Testicular cancer Maternal Grandfather   . Stroke Maternal Grandfather   . Bipolar disorder Paternal Grandfather     Social History:  reports that  has never smoked. she has never used smokeless tobacco. She reports that she does not drink alcohol or use drugs.  Allergies: No Known Allergies  No medications prior to admission.    Review of Systems  Gastrointestinal: Negative for abdominal pain.  Neurological: Negative for headaches.    unknown if currently breastfeeding. Physical Exam  Constitutional: She is oriented to person, place, and time. She appears well-developed.  Cardiovascular: Normal rate and regular rhythm.  Respiratory: Effort normal.  GI: Soft.  Genitourinary: Vagina normal.  Neurological: She is alert and oriented to person, place, and time.    No results found for this or any previous visit (from the past 24 hour(s)).  No results found.  Assessment/Plan: Pt counseled on options of expectant management, cytotec and D&E and elects D&E.  The procedure was reviewed with her in detail including bleeding, infection and possible risk of uterine perforation.  She will use cytotec 3 hours prior and help the cervix to soften to reduce this risk.  She is ready to proceed.  Her blood  type is O negative and she will get rhogam in the PACU before d/c home.  Logan Bores 05/16/2017, 7:55 PM

## 2017-05-17 ENCOUNTER — Encounter (HOSPITAL_COMMUNITY)
Admission: RE | Disposition: A | Discharge status: Home / Self Care | Payer: Self-pay | Source: Ambulatory Visit | Attending: Obstetrics and Gynecology

## 2017-05-17 ENCOUNTER — Other Ambulatory Visit: Payer: Self-pay

## 2017-05-17 ENCOUNTER — Encounter (HOSPITAL_COMMUNITY): Payer: Self-pay | Admitting: Certified Registered Nurse Anesthetist

## 2017-05-17 ENCOUNTER — Ambulatory Visit (HOSPITAL_COMMUNITY)
Admission: RE | Admit: 2017-05-17 | Discharge: 2017-05-17 | Disposition: A | Discharge status: Home / Self Care | Payer: Managed Care, Other (non HMO) | Source: Ambulatory Visit | Attending: Obstetrics and Gynecology | Admitting: Obstetrics and Gynecology

## 2017-05-17 ENCOUNTER — Ambulatory Visit (HOSPITAL_COMMUNITY): Payer: Managed Care, Other (non HMO) | Admitting: Anesthesiology

## 2017-05-17 DIAGNOSIS — F419 Anxiety disorder, unspecified: Secondary | ICD-10-CM | POA: Insufficient documentation

## 2017-05-17 DIAGNOSIS — O021 Missed abortion: Secondary | ICD-10-CM | POA: Insufficient documentation

## 2017-05-17 HISTORY — PX: DILATION AND EVACUATION: SHX1459

## 2017-05-17 LAB — CBC
HCT: 40.5 % (ref 36.0–46.0)
Hemoglobin: 13.8 g/dL (ref 12.0–15.0)
MCH: 31.3 pg (ref 26.0–34.0)
MCHC: 34.1 g/dL (ref 30.0–36.0)
MCV: 91.8 fL (ref 78.0–100.0)
Platelets: 173 10*3/uL (ref 150–400)
RBC: 4.41 MIL/uL (ref 3.87–5.11)
RDW: 13 % (ref 11.5–15.5)
WBC: 6.2 10*3/uL (ref 4.0–10.5)

## 2017-05-17 LAB — TYPE AND SCREEN
ABO/RH(D): O NEG
ANTIBODY SCREEN: NEGATIVE

## 2017-05-17 SURGERY — DILATION AND EVACUATION, UTERUS
Anesthesia: Monitor Anesthesia Care

## 2017-05-17 MED ORDER — LIDOCAINE HCL (CARDIAC) 20 MG/ML IV SOLN
INTRAVENOUS | Status: DC | PRN
  Administered 2017-05-17: 50 mg via INTRAVENOUS

## 2017-05-17 MED ORDER — LIDOCAINE HCL 1 % IJ SOLN
INTRAMUSCULAR | Status: AC
  Filled 2017-05-17: qty 20

## 2017-05-17 MED ORDER — PROPOFOL 10 MG/ML IV BOLUS
INTRAVENOUS | Status: AC
  Filled 2017-05-17: qty 20

## 2017-05-17 MED ORDER — MIDAZOLAM HCL 2 MG/2ML IJ SOLN
INTRAMUSCULAR | Status: DC | PRN
  Administered 2017-05-17: 2 mg via INTRAVENOUS

## 2017-05-17 MED ORDER — FENTANYL CITRATE (PF) 100 MCG/2ML IJ SOLN: 25.0000 ug | INTRAMUSCULAR | Status: DC | PRN

## 2017-05-17 MED ORDER — PROPOFOL 500 MG/50ML IV EMUL
INTRAVENOUS | Status: DC | PRN
  Administered 2017-05-17 (×2): 20 mg via INTRAVENOUS
  Administered 2017-05-17: 50 mg via INTRAVENOUS
  Administered 2017-05-17: 20 mg via INTRAVENOUS

## 2017-05-17 MED ORDER — KETOROLAC TROMETHAMINE 30 MG/ML IJ SOLN
INTRAMUSCULAR | Status: DC | PRN
  Administered 2017-05-17: 30 mg via INTRAVENOUS

## 2017-05-17 MED ORDER — LACTATED RINGERS IV SOLN
INTRAVENOUS | Status: DC
Start: 2017-05-17 — End: 2017-05-17
  Administered 2017-05-17: 125 mL/h via INTRAVENOUS

## 2017-05-17 MED ORDER — ACETAMINOPHEN 500 MG PO TABS
1000.0000 mg | ORAL_TABLET | Freq: Once | ORAL | Status: AC
  Administered 2017-05-17: 1000 mg via ORAL

## 2017-05-17 MED ORDER — MIDAZOLAM HCL 2 MG/2ML IJ SOLN
INTRAMUSCULAR | Status: AC
Start: 2017-05-17 — End: 2017-05-17
  Filled 2017-05-17: qty 2

## 2017-05-17 MED ORDER — KETOROLAC TROMETHAMINE 30 MG/ML IJ SOLN
INTRAMUSCULAR | Status: AC
  Filled 2017-05-17: qty 1

## 2017-05-17 MED ORDER — LACTATED RINGERS IV SOLN: INTRAVENOUS | Status: DC

## 2017-05-17 MED ORDER — FENTANYL CITRATE (PF) 100 MCG/2ML IJ SOLN
INTRAMUSCULAR | Status: AC
  Filled 2017-05-17: qty 2

## 2017-05-17 MED ORDER — ONDANSETRON HCL 4 MG/2ML IJ SOLN
INTRAMUSCULAR | Status: AC
  Filled 2017-05-17: qty 2

## 2017-05-17 MED ORDER — LIDOCAINE HCL 1 % IJ SOLN
INTRAMUSCULAR | Status: DC | PRN
  Administered 2017-05-17: 20 mL

## 2017-05-17 MED ORDER — SCOPOLAMINE 1 MG/3DAYS TD PT72
MEDICATED_PATCH | TRANSDERMAL | Status: AC
  Administered 2017-05-17: 1.5 mg via TRANSDERMAL
  Filled 2017-05-17: qty 1

## 2017-05-17 MED ORDER — ONDANSETRON HCL 4 MG/2ML IJ SOLN
INTRAMUSCULAR | Status: DC | PRN
  Administered 2017-05-17: 4 mg via INTRAVENOUS

## 2017-05-17 MED ORDER — SCOPOLAMINE 1 MG/3DAYS TD PT72
1.0000 | MEDICATED_PATCH | Freq: Once | TRANSDERMAL | Status: DC
  Administered 2017-05-17: 1.5 mg via TRANSDERMAL

## 2017-05-17 MED ORDER — ACETAMINOPHEN 500 MG PO TABS
ORAL_TABLET | ORAL | Status: AC
  Filled 2017-05-17: qty 2

## 2017-05-17 MED ORDER — DEXAMETHASONE SODIUM PHOSPHATE 10 MG/ML IJ SOLN
INTRAMUSCULAR | Status: DC | PRN
  Administered 2017-05-17: 4 mg via INTRAVENOUS

## 2017-05-17 MED ORDER — DEXAMETHASONE SODIUM PHOSPHATE 4 MG/ML IJ SOLN
INTRAMUSCULAR | Status: AC
  Filled 2017-05-17: qty 1

## 2017-05-17 MED ORDER — FENTANYL CITRATE (PF) 100 MCG/2ML IJ SOLN
INTRAMUSCULAR | Status: DC | PRN
  Administered 2017-05-17 (×2): 50 ug via INTRAVENOUS

## 2017-05-17 MED ORDER — RHO D IMMUNE GLOBULIN 1500 UNIT/2ML IJ SOSY
300.0000 ug | PREFILLED_SYRINGE | Freq: Once | INTRAMUSCULAR | Status: AC
  Administered 2017-05-17: 300 ug via INTRAVENOUS
  Filled 2017-05-17: qty 2

## 2017-05-17 MED ORDER — PROMETHAZINE HCL 25 MG/ML IJ SOLN: 6.2500 mg | INTRAMUSCULAR | Status: DC | PRN

## 2017-05-17 SURGICAL SUPPLY — 18 items
CATH ROBINSON RED A/P 16FR (CATHETERS) ×3 IMPLANT
DECANTER SPIKE VIAL GLASS SM (MISCELLANEOUS) ×3 IMPLANT
GLOVE BIO SURGEON STRL SZ 6.5 (GLOVE) ×2 IMPLANT
GLOVE BIO SURGEONS STRL SZ 6.5 (GLOVE) ×1
GLOVE BIOGEL PI IND STRL 7.0 (GLOVE) ×1 IMPLANT
GLOVE BIOGEL PI INDICATOR 7.0 (GLOVE) ×2
GOWN STRL REUS W/TWL LRG LVL3 (GOWN DISPOSABLE) ×6 IMPLANT
KIT BERKELEY 1ST TRIMESTER 3/8 (MISCELLANEOUS) ×3 IMPLANT
NS IRRIG 1000ML POUR BTL (IV SOLUTION) ×3 IMPLANT
PACK VAGINAL MINOR WOMEN LF (CUSTOM PROCEDURE TRAY) ×3 IMPLANT
PAD OB MATERNITY 4.3X12.25 (PERSONAL CARE ITEMS) ×3 IMPLANT
PAD PREP 24X48 CUFFED NSTRL (MISCELLANEOUS) ×3 IMPLANT
SET BERKELEY SUCTION TUBING (SUCTIONS) ×3 IMPLANT
TOWEL OR 17X24 6PK STRL BLUE (TOWEL DISPOSABLE) ×6 IMPLANT
VACURETTE 10 RIGID CVD (CANNULA) IMPLANT
VACURETTE 7MM CVD STRL WRAP (CANNULA) ×3 IMPLANT
VACURETTE 8 RIGID CVD (CANNULA) IMPLANT
VACURETTE 9 RIGID CVD (CANNULA) IMPLANT

## 2017-05-17 NOTE — Discharge Instructions (Signed)
DISCHARGE INSTRUCTIONS: D&C / D&E The following instructions have been prepared to help you care for yourself upon your return home.   Personal hygiene:  Use sanitary pads for vaginal drainage, not tampons.  Shower the day after your procedure.  NO tub baths, pools or Jacuzzis for 2-3 weeks.  Wipe front to back after using the bathroom.  Activity and limitations:  Do NOT drive or operate any equipment for 24 hours. The effects of anesthesia are still present and drowsiness may result.  Do NOT rest in bed all day.  Walking is encouraged.  Walk up and down stairs slowly.  You may resume your normal activity in one to two days or as indicated by your physician.  Sexual activity: NO intercourse for at least 2 weeks after the procedure, or as indicated by your physician.  Diet: Eat a light meal as desired this evening. You may resume your usual diet tomorrow.  Return to work: You may resume your work activities in one to two days or as indicated by your doctor.  What to expect after your surgery: Expect to have vaginal bleeding/discharge for 2-3 days and spotting for up to 10 days. It is not unusual to have soreness for up to 1-2 weeks. You may have a slight burning sensation when you urinate for the first day. Mild cramps may continue for a couple of days. You may have a regular period in 2-6 weeks.  Call your doctor for any of the following:  Excessive vaginal bleeding, saturating and changing one pad every hour.  Inability to urinate 6 hours after discharge from hospital.  Pain not relieved by pain medication.  Fever of 100.4 F or greater.  Unusual vaginal discharge or odor.   Call for an appointment:    Patients signature: ______________________  Nurses signature ________________________  Support person's signature_______________________     Post Anesthesia Home Care Instructions  Activity: Get plenty of rest for the remainder of the day. A responsible  individual must stay with you for 24 hours following the procedure.  For the next 24 hours, DO NOT: -Drive a car -Paediatric nurse -Drink alcoholic beverages -Take any medication unless instructed by your physician -Make any legal decisions or sign important papers.  Meals: Start with liquid foods such as gelatin or soup. Progress to regular foods as tolerated. Avoid greasy, spicy, heavy foods. If nausea and/or vomiting occur, drink only clear liquids until the nausea and/or vomiting subsides. Call your physician if vomiting continues.  Special Instructions/Symptoms: Your throat may feel dry or sore from the anesthesia or the breathing tube placed in your throat during surgery. If this causes discomfort, gargle with warm salt water. The discomfort should disappear within 24 hours.  If you had a scopolamine patch placed behind your ear for the management of post- operative nausea and/or vomiting:  1. The medication in the patch is effective for 72 hours, after which it should be removed.  Wrap patch in a tissue and discard in the trash. Wash hands thoroughly with soap and water. 2. You may remove the patch earlier than 72 hours if you experience unpleasant side effects which may include dry mouth, dizziness or visual disturbances. 3. Avoid touching the patch. Wash your hands with soap and water after contact with the patch.     NO IBUPROFEN PRODUCTS UNTIL 2:40PM TODAY.

## 2017-05-17 NOTE — Op Note (Signed)
Operative Note    Preoperative Diagnosis Missed abortion at 7 weeks  Postoperative Diagnosis Same  Procedure Suction Dilation and Evacuation  Surgeon Paula Compton, MD  Anesthesia MAC  Fluids: EBL 57m UOP 270mstraight cath prior IVF 80082mR  Findings Moderate POC's, uterus 7 weeks size  Specimen Products of conception  Procedure Note Pt was taken to operating room where LMA anesthesia was obtained without difficulty.  She was prepped and draped in the normal sterile fashion in the dorsal lithotomy position.  An appropriate timeout was performed.  A speculum was placed in the vagina and 2cc of 1% plain lidocaine injected into the anterior cervix.  An additional 9cc was placed both at 2 and 10 o'clock for a paracervical block.  A single tooth tenaculum was placed on the anterior lip of the cervix. The cervix was slightly dilated from the pt's cytotec treatment and  sound was easily introduced into the uterus and the uterus sounded to about 8cm.  The 7mm42mction currette was obtained and introduced into the fundus.  With several passes, a moderate amount of tissue was obtained.  When no further tissue was noted, the suction was discontinued and a gentle currettage performed.  One additional pass revealed no further tissue, and the procedure was completed.  The tenaculum was removed and silver nitrate used for hemostasis.  All instruments and sponges were removed from vagina and the patient awakened and taken to the PACU in good condition.

## 2017-05-17 NOTE — Transfer of Care (Signed)
Immediate Anesthesia Transfer of Care Note  Patient: Jasmine Bell  Procedure(s) Performed: DILATATION AND EVACUATION (N/A )  Patient Location: PACU  Anesthesia Type:MAC  Level of Consciousness: awake, alert  and oriented  Airway & Oxygen Therapy: Patient Spontanous Breathing and Patient connected to nasal cannula oxygen  Post-op Assessment: Report given to RN and Post -op Vital signs reviewed and stable  Post vital signs: Reviewed and stable  Last Vitals:  Vitals:   05/17/17 0720  BP: 112/70  Pulse: 93  Resp: 16  Temp: 36.7 C  SpO2: 100%    Last Pain:  Vitals:   05/17/17 0720  TempSrc: Oral      Patients Stated Pain Goal: 3 (58/85/02 7741)  Complications: No apparent anesthesia complications

## 2017-05-17 NOTE — Anesthesia Preprocedure Evaluation (Signed)
Anesthesia Evaluation  Patient identified by MRN, date of birth, ID band Patient awake    Reviewed: Allergy & Precautions, NPO status , Patient's Chart, lab work & pertinent test results  Airway Mallampati: II  TM Distance: >3 FB Neck ROM: Full    Dental  (+) Teeth Intact, Dental Advisory Given   Pulmonary Recent URI , Residual Cough,    Pulmonary exam normal breath sounds clear to auscultation       Cardiovascular negative cardio ROS Normal cardiovascular exam Rhythm:Regular Rate:Normal     Neuro/Psych PSYCHIATRIC DISORDERS Anxiety negative neurological ROS     GI/Hepatic negative GI ROS, Neg liver ROS,   Endo/Other  negative endocrine ROS  Renal/GU negative Renal ROS     Musculoskeletal negative musculoskeletal ROS (+)   Abdominal   Peds  Hematology negative hematology ROS (+)   Anesthesia Other Findings Day of surgery medications reviewed with the patient.  Reproductive/Obstetrics (+) Pregnancy (Missed abortion)                             Anesthesia Physical Anesthesia Plan  ASA: II  Anesthesia Plan: MAC   Post-op Pain Management:    Induction: Intravenous  PONV Risk Score and Plan: 2 and Propofol infusion, Midazolam and Ondansetron  Airway Management Planned: Simple Face Mask  Additional Equipment:   Intra-op Plan:   Post-operative Plan:   Informed Consent: I have reviewed the patients History and Physical, chart, labs and discussed the procedure including the risks, benefits and alternatives for the proposed anesthesia with the patient or authorized representative who has indicated his/her understanding and acceptance.   Dental advisory given  Plan Discussed with: CRNA and Anesthesiologist  Anesthesia Plan Comments: (Discussed risks/benefits/alternatives to MAC sedation including need for ventilatory support, hypotension, need for conversion to general anesthesia.   All patient questions answered.  Patient/guardian wishes to proceed.)        Anesthesia Quick Evaluation

## 2017-05-17 NOTE — Progress Notes (Signed)
Patient ID: Jasmine Bell, female   DOB: 1985/08/15, 31 y.o.   MRN: [id number] Per pt no changes in dictated H&P, ready to proceed. Brief exam WNL.

## 2017-05-17 NOTE — Anesthesia Postprocedure Evaluation (Signed)
Anesthesia Post Note  Patient: Jasmine Bell  Procedure(s) Performed: DILATATION AND EVACUATION (N/A )     Patient location during evaluation: PACU Anesthesia Type: MAC Level of consciousness: awake and alert Pain management: pain level controlled Vital Signs Assessment: post-procedure vital signs reviewed and stable Respiratory status: spontaneous breathing, nonlabored ventilation and respiratory function stable Cardiovascular status: stable and blood pressure returned to baseline Postop Assessment: no apparent nausea or vomiting Anesthetic complications: no    Last Vitals:  Vitals:   05/17/17 0915 05/17/17 0924  BP: 99/64   Pulse: 68   Resp: 15   Temp:  37 C  SpO2: 98%     Last Pain:  Vitals:   05/17/17 0720  TempSrc: Oral   Pain Goal: Patients Stated Pain Goal: 3 (05/17/17 0720)               Catalina Gravel

## 2017-05-18 ENCOUNTER — Encounter (HOSPITAL_COMMUNITY): Payer: Self-pay | Admitting: Obstetrics and Gynecology

## 2017-05-18 LAB — RH IG WORKUP (INCLUDES ABO/RH)
ABO/RH(D): O NEG
Gestational Age(Wks): 7
Unit division: 0

## 2017-12-25 ENCOUNTER — Other Ambulatory Visit: Payer: Self-pay

## 2017-12-25 ENCOUNTER — Ambulatory Visit: Payer: 59 | Admitting: Physician Assistant

## 2017-12-25 ENCOUNTER — Encounter: Payer: Self-pay | Admitting: Physician Assistant

## 2017-12-25 VITALS — BP 100/62 | HR 85 | Temp 98.7°F | Resp 18 | Ht 63.0 in | Wt 120.4 lb

## 2017-12-25 DIAGNOSIS — J029 Acute pharyngitis, unspecified: Secondary | ICD-10-CM

## 2017-12-25 LAB — POCT RAPID STREP A (OFFICE): Rapid Strep A Screen: NEGATIVE

## 2017-12-25 NOTE — Patient Instructions (Addendum)
Please download the APP called mychart - then use the text to activate this APP - this will allow you to look at your labs and contact me as well as make appointments to see me in the future.     IF you received an x-ray today, you will receive an invoice from [facility]. Please contact [facility] at [phone] with questions or concerns regarding your invoice.   IF you received labwork today, you will receive an invoice from [facility]. Please contact [facility] at [phone] with questions or concerns regarding your invoice.   Our billing staff will not be able to assist you with questions regarding bills from these companies.  You will be contacted with the lab results as soon as they are available. The fastest way to get your results is to activate your My Chart account. Instructions are located on the last page of this paperwork. If you have not heard from [place] regarding the results in 2 weeks, please contact this office.

## 2017-12-25 NOTE — Progress Notes (Signed)
Jasmine Bell  MRN: [id number] DOB: 06-18-1986  PCP: Donella Stade, PA-C  Chief Complaint  Patient presents with  . Sore Throat    x5weeks with a cough   . Establish Care    Subjective:  Jasmine Bell is a 32 year old female presenting for evaluation of a sore throat. She began feeling symptoms 11/27/17 including difficulty swallowing due to pain. She had a throat swab at an urgent care which was negative. Her symptoms persisted until she visited another urgent care 7 days later and was given amoxicillin x 7 days. This brought relief but after the end of the regimen the symptoms returned along with an intermittent productive cough. She also noticed a couple bumps on her soft and hard palate that eventually resolved. She does note two on her soft palate and hard palate that has developed. 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  History is obtained by patient.  Review of Systems  Constitutional: Negative for chills, fatigue and fever.  HENT: Positive for trouble swallowing (pain). Negative for ear pain and sore throat (worse in the evening).   Eyes: Negative for photophobia, pain and visual disturbance.  Respiratory: Positive for cough (from PND). Negative for shortness of breath.   Cardiovascular: Negative for chest pain.  Gastrointestinal: Negative for constipation, diarrhea and vomiting.       No problems with heartburn  Musculoskeletal: Negative for myalgias.    Patient Active Problem List   Diagnosis Date Noted  . GAD (generalized anxiety disorder) 07/06/2012  . GERD 08/18/2010    Current Outpatient Medications on File Prior to Visit  Medication Sig Dispense Refill  . Prenatal Vit-Fe Fumarate-FA (MULTIVITAMIN-PRENATAL) 27-0.8 MG TABS tablet Take 1 tablet daily by mouth.      No current facility-administered  medications on file prior to visit.     No Known Allergies  Past Medical History:  Diagnosis Date  . Anxiety    Social History   Social History Narrative  . Not on file   Social History   Tobacco Use  . Smoking status: Never Smoker  . Smokeless tobacco: Never Used  Substance Use Topics  . Alcohol use: No  . Drug use: No   family history includes Bipolar disorder in her paternal grandfather; Breast cancer in her maternal aunt; Depression in her father; Diabetes in her paternal aunt; Stroke in her maternal grandfather; Testicular cancer in her maternal grandfather.     Objective:  BP 100/62   Pulse 85   Temp 98.7 F (37.1 C) (Oral)   Resp 18   Ht 5\' 3"  (1.6 m)   Wt 120 lb 6.4 oz (54.6 kg)   LMP 12/25/2017   SpO2 100%   BMI 21.33 kg/m  Body mass index is 21.33 kg/m.  Wt Readings from Last 3 Encounters:  12/25/17 120 lb 6.4 oz (54.6 kg)  05/17/17 126 lb (57.2 kg)  03/15/16 158 lb (71.7 kg)    Physical Exam  Constitutional: She is oriented to person, place, and time. She appears well-developed and well-nourished. No distress.  HENT:  Right Ear: Hearing and tympanic membrane normal.  Left Ear: Hearing and tympanic membrane normal.  Mouth/Throat: Oral lesions (papular lesion middle palate) present. Tonsils are 0 on the right. Tonsils are 0 on the left. No tonsillar exudate.  Neck: Normal range  of motion. Neck supple.  Cardiovascular: Normal rate, regular rhythm and normal heart sounds.  Pulmonary/Chest: Effort normal and breath sounds normal. She has no wheezes. She has no rhonchi. She has no rales.  Abdominal: Soft. She exhibits no mass.  Lymphadenopathy:       Head (right side): No occipital adenopathy present.       Head (left side): No occipital adenopathy present.    She has cervical adenopathy (anterior).       Right cervical: No superficial cervical and no deep cervical adenopathy present.      Left cervical: No superficial cervical and no deep cervical  adenopathy present.    She has no axillary adenopathy.       Right: No supraclavicular adenopathy present.       Left: No supraclavicular adenopathy present.  Neurological: She is alert and oriented to person, place, and time.  Skin: Skin is warm and dry. No rash noted.  Psychiatric: She has a normal mood and affect. Her behavior is normal.  Vitals reviewed.   Assessment and Plan :  Sore throat - Plan: POCT rapid strep A, Epstein-Barr virus VCA antibody panel, Culture, Group A Strep, CBC with Differential/Platelet  1. Sore throat Suspect to be viral etiology that is recovering due to decline of symptoms, negative rapid strep test, and recent antibiotic treatment. However, will perform throat culture, CBC w/ diff and EBV panel to differentiate between other potential etiologies. Pt appears nervous as she does not have a diffinitive diagnosis.  - POCT rapid strep A - Epstein-Barr virus VCA antibody panel - Culture, Group A Strep - CBC with Differential/Platelet  Patient verbalized to me that they understand the following: diagnosis, what is being done for them, what to expect and what should be done at home.  Their questions have been answered.  See after visit summary for patient specific instructions.  Patient was initially seen by a PA student who took the history and did a physical exam. I confirmed the history with the patient and performed an independent exam.  I was directly involved with the patient's care and agree with the diagnosis and treatment plan. Note was adjusted with my history and physical exam findings.   Windell Hummingbird PA-C  [facility] 12/25/2017 6:01 PM  Please note: Portions of this report may have been transcribed using dragon voice recognition software. Every effort was made to ensure accuracy; however, inadvertent computerized transcription errors may be present.

## 2017-12-26 LAB — CBC WITH DIFFERENTIAL/PLATELET
BASOS ABS: 0 10*3/uL (ref 0.0–0.2)
Basos: 0 %
EOS (ABSOLUTE): 0.1 10*3/uL (ref 0.0–0.4)
EOS: 1 %
HEMATOCRIT: 41.9 % (ref 34.0–46.6)
HEMOGLOBIN: 14.2 g/dL (ref 11.1–15.9)
IMMATURE GRANULOCYTES: 0 %
Immature Grans (Abs): 0 10*3/uL (ref 0.0–0.1)
LYMPHS: 37 %
Lymphocytes Absolute: 2.7 10*3/uL (ref 0.7–3.1)
MCH: 31.3 pg (ref 26.6–33.0)
MCHC: 33.9 g/dL (ref 31.5–35.7)
MCV: 93 fL (ref 79–97)
MONOCYTES: 6 %
Monocytes Absolute: 0.4 10*3/uL (ref 0.1–0.9)
NEUTROS PCT: 56 %
Neutrophils Absolute: 4 10*3/uL (ref 1.4–7.0)
Platelets: 223 10*3/uL (ref 150–450)
RBC: 4.53 x10E6/uL (ref 3.77–5.28)
RDW: 13 % (ref 12.3–15.4)
WBC: 7.2 10*3/uL (ref 3.4–10.8)

## 2017-12-26 LAB — EPSTEIN-BARR VIRUS VCA ANTIBODY PANEL
EBV Early Antigen Ab, IgG: 37.9 U/mL — ABNORMAL HIGH (ref 0.0–8.9)
EBV NA IgG: >600 U/mL — ABNORMAL HIGH (ref 0.0–17.9)
EBV VCA IgG: >600 U/mL — ABNORMAL HIGH (ref 0.0–17.9)
EBV VCA IgM: <36 U/mL (ref 0.0–35.9)

## 2017-12-27 LAB — CULTURE, GROUP A STREP: STREP A CULTURE: NEGATIVE

## 2018-01-11 ENCOUNTER — Encounter: Payer: 59 | Admitting: Physician Assistant

## 2018-01-11 DIAGNOSIS — K120 Recurrent oral aphthae: Secondary | ICD-10-CM | POA: Diagnosis not present

## 2018-01-12 ENCOUNTER — Encounter: Payer: Self-pay | Admitting: Physician Assistant

## 2018-01-12 ENCOUNTER — Ambulatory Visit (INDEPENDENT_AMBULATORY_CARE_PROVIDER_SITE_OTHER): Payer: 59 | Admitting: Physician Assistant

## 2018-01-12 VITALS — BP 98/64 | HR 78 | Temp 98.2°F | Resp 18 | Ht 63.39 in | Wt 123.4 lb

## 2018-01-12 DIAGNOSIS — Z0000 Encounter for general adult medical examination without abnormal findings: Secondary | ICD-10-CM

## 2018-01-12 DIAGNOSIS — Z13220 Encounter for screening for lipoid disorders: Secondary | ICD-10-CM | POA: Diagnosis not present

## 2018-01-12 DIAGNOSIS — Z1389 Encounter for screening for other disorder: Secondary | ICD-10-CM

## 2018-01-12 DIAGNOSIS — Z1329 Encounter for screening for other suspected endocrine disorder: Secondary | ICD-10-CM

## 2018-01-12 DIAGNOSIS — Z13228 Encounter for screening for other metabolic disorders: Secondary | ICD-10-CM | POA: Diagnosis not present

## 2018-01-12 DIAGNOSIS — Z124 Encounter for screening for malignant neoplasm of cervix: Secondary | ICD-10-CM

## 2018-01-12 NOTE — Progress Notes (Signed)
Jasmine Bell  MRN: [id number] DOB: [birth year]  Subjective:  Pt is a 32 y.o. female who presents for annual physical exam. Pt is fasting today.   Exercise: Walks a lot Diet: Balanced diet. Meats, veggies, fruits. Likes fish. Drinks mostly water, coffee, and wine. Takes prenatal daily. Menstrual cycles: Regular, occur monthly. Is wanting to conceive within in the next year.  BM: daily  Last dental exam: 6 mths ago, brushes BID Last vision exam: 3 years ago Last pap smear: > 3 years ago  Vaccinations      Tetanus: 2017      HPV: Completed        Patient Active Problem List   Diagnosis Date Noted  . History of infertility 02/12/2016    Current Outpatient Medications on File Prior to Visit  Medication Sig Dispense Refill  . Prenatal Vit-Fe Fumarate-FA (MULTIVITAMIN-PRENATAL) 27-0.8 MG TABS tablet Take 1 tablet daily by mouth.      No current facility-administered medications on file prior to visit.     No Known Allergies  Social History   Socioeconomic History  . Marital status: Married    Spouse name: Not on file  . Number of children: 1  . Years of education: Not on file  . Highest education level: Master's degree (e.g., MA, MS, MEng, MEd, MSW, MBA)  Occupational History  . Occupation: Immunologist  Social Needs  . Financial resource strain: Not hard at all  . Food insecurity:    Worry: Never true    Inability: Never true  . Transportation needs:    Medical: No    Non-medical: No  Tobacco Use  . Smoking status: Never Smoker  . Smokeless tobacco: Never Used  Substance and Sexual Activity  . Alcohol use: Yes    Comment: occ  . Drug use: No  . Sexual activity: Yes    Partners: Male    Birth control/protection: None    Comment: with monogamous husband  Lifestyle  . Physical activity:    Days per week: 2 days    Minutes per session: 30 min  . Stress: Only a little  Relationships  . Social connections:    Talks on phone: More than three times a week   Gets together: Three times a week    Attends religious service: Never    Active member of club or organization: No    Attends meetings of clubs or organizations: Never    Relationship status: Married  Other Topics Concern  . Not on file  Social History Narrative   Pt is from [place]. Lives in [place]. Works as Immunologist. Lives at home with husband and 25 yo son.     Past Surgical History:  Procedure Laterality Date  . aerolar cyst on breast removed    . BREAST SURGERY     areolar cyst removal  . DILATION AND EVACUATION  04/12/2016   Procedure: DILATATION AND EVACUATION;  Surgeon: Cheri Fowler, MD;  Location: [facility];  Service: Gynecology;;  . Brigitte Pulse AND EVACUATION N/A 05/17/2017   Procedure: DILATATION AND EVACUATION;  Surgeon: Paula Compton, MD;  Location: [facility];  Service: Gynecology;  Laterality: N/A;    Family History  Problem Relation Age of Onset  . Depression Father   . Mental illness Father   . Breast cancer Maternal Aunt   . Diabetes Paternal Aunt   . Testicular cancer Maternal Grandfather   . Bipolar disorder Paternal Grandfather   . Heart disease Paternal Grandfather   . Hyperlipidemia  Paternal Grandfather   . Cancer Maternal Grandmother   . Stroke Maternal Grandmother   . Mental illness Maternal Grandmother     Review of Systems  Constitutional: Negative for activity change, appetite change, chills, diaphoresis, fatigue, fever and unexpected weight change.  HENT: Negative for congestion, dental problem, drooling, ear discharge, ear pain, facial swelling, hearing loss, mouth sores, nosebleeds, postnasal drip, rhinorrhea, sinus pressure, sinus pain, sneezing, sore throat, tinnitus, trouble swallowing and voice change.   Eyes: Negative for photophobia, pain, discharge, redness, itching and visual disturbance.  Respiratory: Negative for apnea, cough, choking, chest tightness, shortness of breath, wheezing and stridor.   Cardiovascular: Negative for chest  pain, palpitations and leg swelling.  Gastrointestinal: Negative for abdominal distention, abdominal pain, anal bleeding, blood in stool, constipation, diarrhea, nausea, rectal pain and vomiting.  Endocrine: Negative for cold intolerance, heat intolerance, polydipsia, polyphagia and polyuria.  Genitourinary: Negative for decreased urine volume, difficulty urinating, dyspareunia, dysuria, enuresis, flank pain, frequency, genital sores, hematuria, menstrual problem, pelvic pain, urgency, vaginal bleeding, vaginal discharge and vaginal pain.  Musculoskeletal: Negative for arthralgias, back pain, gait problem, joint swelling, myalgias, neck pain and neck stiffness.  Skin: Negative for color change, pallor, rash and wound.  Allergic/Immunologic: Negative for environmental allergies, food allergies and immunocompromised state.  Neurological: Negative for dizziness, tremors, seizures, syncope, facial asymmetry, speech difficulty, weakness, light-headedness, numbness and headaches.  Hematological: Negative for adenopathy. Does not bruise/bleed easily.  Psychiatric/Behavioral: Negative for agitation, behavioral problems, confusion, decreased concentration, dysphoric mood, hallucinations, self-injury, sleep disturbance and suicidal ideas. The patient is not nervous/anxious and is not hyperactive.     Objective:  BP 98/64 (BP Location: Right Arm, Patient Position: Sitting, Cuff Size: Normal)   Pulse 78   Temp 98.2 F (36.8 C) (Oral)   Resp 18   Ht 5' 3.39" (1.61 m)   Wt 123 lb 6.4 oz (56 kg)   LMP 12/25/2017   SpO2 100%   Breastfeeding? No   BMI 21.59 kg/m   Physical Exam  Constitutional: She is oriented to person, place, and time. She appears well-developed and well-nourished. No distress.  HENT:  Head: Normocephalic and atraumatic.  Right Ear: Hearing, tympanic membrane, external ear and ear canal normal.  Left Ear: Hearing, tympanic membrane, external ear and ear canal normal.  Nose: Nose  normal.  Mouth/Throat: Uvula is midline, oropharynx is clear and moist and mucous membranes are normal. No oropharyngeal exudate.  Eyes: Pupils are equal, round, and reactive to light. Conjunctivae, EOM and lids are normal. No scleral icterus.  Neck: Trachea normal and normal range of motion. No thyroid mass and no thyromegaly present.  Cardiovascular: Normal rate, regular rhythm, normal heart sounds and intact distal pulses.  Pulmonary/Chest: Effort normal and breath sounds normal.  Abdominal: Soft. Normal appearance and bowel sounds are normal. There is no tenderness.  Genitourinary: Vagina normal and uterus normal. Cervix exhibits no motion tenderness, no discharge and no friability. Right adnexum displays no mass, no tenderness and no fullness. Left adnexum displays no mass, no tenderness and no fullness.  Genitourinary Comments: CMA present for GU exam.   Lymphadenopathy:       Head (right side): No tonsillar, no preauricular, no posterior auricular and no occipital adenopathy present.       Head (left side): No tonsillar, no preauricular, no posterior auricular and no occipital adenopathy present.    She has no cervical adenopathy.       Right: No supraclavicular adenopathy present.  Left: No supraclavicular adenopathy present.  Neurological: She is alert and oriented to person, place, and time. She has normal strength and normal reflexes.  Skin: Skin is warm and dry.    Visual Acuity Screening   Right eye Left eye Both eyes  Without correction: 20/40 20/40 20/25   With correction:       Assessment and Plan :  Discussed healthy lifestyle, diet, exercise, preventative care, vaccinations, and addressed patient's concerns. Plan for follow up in one year. Otherwise, plan for specific conditions below.  1. Annual physical exam Await lab results.  2. Screening for metabolic disorder - YOM60+OKHT  3. Screening for thyroid disorder - TSH  4. Screening cholesterol level - Lipid  panel  5. Screening for hematuria or proteinuria - Urinalysis, dipstick only  6. Screening for cervical cancer - Pap IG and HPV (high risk) DNA detection   Tenna Delaine, PA-C  [facility] 01/12/2018 9:54 AM

## 2018-01-12 NOTE — Patient Instructions (Addendum)
It was a pleasure meeting you today. We should have your lab results back within one week. Thank you for letting me participate in your health and well being.  Health Maintenance, Female Adopting a healthy lifestyle and getting preventive care can go a long way to promote health and wellness. Talk with your health care provider about what schedule of regular examinations is right for you. This is a good chance for you to check in with your provider about disease prevention and staying healthy. In between checkups, there are plenty of things you can do on your own. Experts have done a lot of research about which lifestyle changes and preventive measures are most likely to keep you healthy. Ask your health care provider for more information. Weight and diet Eat a healthy diet  Be sure to include plenty of vegetables, fruits, low-fat dairy products, and lean protein.  Do not eat a lot of foods high in solid fats, added sugars, or salt.  Get regular exercise. This is one of the most important things you can do for your health. ? Most adults should exercise for at least 150 minutes each week. The exercise should increase your heart rate and make you sweat (moderate-intensity exercise). ? Most adults should also do strengthening exercises at least twice a week. This is in addition to the moderate-intensity exercise.  Maintain a healthy weight  Body mass index (BMI) is a measurement that can be used to identify possible weight problems. It estimates body fat based on height and weight. Your health care provider can help determine your BMI and help you achieve or maintain a healthy weight.  For females 55 years of age and older: ? A BMI below 18.5 is considered underweight. ? A BMI of 18.5 to 24.9 is normal. ? A BMI of 25 to 29.9 is considered overweight. ? A BMI of 30 and above is considered obese.  Watch levels of cholesterol and blood lipids  You should start having your blood tested for  lipids and cholesterol at 33 years of age, then have this test every 5 years.  You may need to have your cholesterol levels checked more often if: ? Your lipid or cholesterol levels are high. ? You are older than 33 years of age. ? You are at high risk for heart disease.  Cancer screening Lung Cancer  Lung cancer screening is recommended for adults 25-80 years old who are at high risk for lung cancer because of a history of smoking.  A yearly low-dose CT scan of the lungs is recommended for people who: ? Currently smoke. ? Have quit within the past 15 years. ? Have at least a 30-pack-year history of smoking. A pack year is smoking an average of one pack of cigarettes a day for 1 year.  Yearly screening should continue until it has been 15 years since you quit.  Yearly screening should stop if you develop a health problem that would prevent you from having lung cancer treatment.  Breast Cancer  Practice breast self-awareness. This means understanding how your breasts normally appear and feel.  It also means doing regular breast self-exams. Let your health care provider know about any changes, no matter how small.  If you are in your 20s or 30s, you should have a clinical breast exam (CBE) by a health care provider every 1-3 years as part of a regular health exam.  If you are 70 or older, have a CBE every year. Also consider having a breast  X-ray (mammogram) every year.  If you have a family history of breast cancer, talk to your health care provider about genetic screening.  If you are at high risk for breast cancer, talk to your health care provider about having an MRI and a mammogram every year.  Breast cancer gene (BRCA) assessment is recommended for women who have family members with BRCA-related cancers. BRCA-related cancers include: ? Breast. ? Ovarian. ? Tubal. ? Peritoneal cancers.  Results of the assessment will determine the need for genetic counseling and BRCA1 and  BRCA2 testing.  Cervical Cancer Your health care provider may recommend that you be screened regularly for cancer of the pelvic organs (ovaries, uterus, and vagina). This screening involves a pelvic examination, including checking for microscopic changes to the surface of your cervix (Pap test). You may be encouraged to have this screening done every 3 years, beginning at age 84.  For women ages 24-65, health care providers may recommend pelvic exams and Pap testing every 3 years, or they may recommend the Pap and pelvic exam, combined with testing for human papilloma virus (HPV), every 5 years. Some types of HPV increase your risk of cervical cancer. Testing for HPV may also be done on women of any age with unclear Pap test results.  Other health care providers may not recommend any screening for nonpregnant women who are considered low risk for pelvic cancer and who do not have symptoms. Ask your health care provider if a screening pelvic exam is right for you.  If you have had past treatment for cervical cancer or a condition that could lead to cancer, you need Pap tests and screening for cancer for at least 20 years after your treatment. If Pap tests have been discontinued, your risk factors (such as having a new sexual partner) need to be reassessed to determine if screening should resume. Some women have medical problems that increase the chance of getting cervical cancer. In these cases, your health care provider may recommend more frequent screening and Pap tests.  Colorectal Cancer  This type of cancer can be detected and often prevented.  Routine colorectal cancer screening usually begins at 33 years of age and continues through 33 years of age.  Your health care provider may recommend screening at an earlier age if you have risk factors for colon cancer.  Your health care provider may also recommend using home test kits to check for hidden blood in the stool.  A small camera at the  end of a tube can be used to examine your colon directly (sigmoidoscopy or colonoscopy). This is done to check for the earliest forms of colorectal cancer.  Routine screening usually begins at age 33.  Direct examination of the colon should be repeated every 5-10 years through 33 years of age. However, you may need to be screened more often if early forms of precancerous polyps or small growths are found.  Skin Cancer  Check your skin from head to toe regularly.  Tell your health care provider about any new moles or changes in moles, especially if there is a change in a mole's shape or color.  Also tell your health care provider if you have a mole that is larger than the size of a pencil eraser.  Always use sunscreen. Apply sunscreen liberally and repeatedly throughout the day.  Protect yourself by wearing long sleeves, pants, a wide-brimmed hat, and sunglasses whenever you are outside.  Heart disease, diabetes, and high blood pressure  High blood  pressure causes heart disease and increases the risk of stroke. High blood pressure is more likely to develop in: ? People who have blood pressure in the high end of the normal range (130-139/85-89 mm Hg). ? People who are overweight or obese. ? People who are African American.  If you are 30-41 years of age, have your blood pressure checked every 3-5 years. If you are 33 years of age or older, have your blood pressure checked every year. You should have your blood pressure measured twice-once when you are at a hospital or clinic, and once when you are not at a hospital or clinic. Record the average of the two measurements. To check your blood pressure when you are not at a hospital or clinic, you can use: ? An automated blood pressure machine at a pharmacy. ? A home blood pressure monitor.  If you are between 99 years and 45 years old, ask your health care provider if you should take aspirin to prevent strokes.  Have regular diabetes  screenings. This involves taking a blood sample to check your fasting blood sugar level. ? If you are at a normal weight and have a low risk for diabetes, have this test once every three years after 33 years of age. ? If you are overweight and have a high risk for diabetes, consider being tested at a younger age or more often. Preventing infection Hepatitis B  If you have a higher risk for hepatitis B, you should be screened for this virus. You are considered at high risk for hepatitis B if: ? You were born in a country where hepatitis B is common. Ask your health care provider which countries are considered high risk. ? Your parents were born in a high-risk country, and you have not been immunized against hepatitis B (hepatitis B vaccine). ? You have HIV or AIDS. ? You use needles to inject street drugs. ? You live with someone who has hepatitis B. ? You have had sex with someone who has hepatitis B. ? You get hemodialysis treatment. ? You take certain medicines for conditions, including cancer, organ transplantation, and autoimmune conditions.  Hepatitis C  Blood testing is recommended for: ? Everyone born from [birth year] through 1966. ? Anyone with known risk factors for hepatitis C.  Sexually transmitted infections (STIs)  You should be screened for sexually transmitted infections (STIs) including gonorrhea and chlamydia if: ? You are sexually active and are younger than 33 years of age. ? You are older than 33 years of age and your health care provider tells you that you are at risk for this type of infection. ? Your sexual activity has changed since you were last screened and you are at an increased risk for chlamydia or gonorrhea. Ask your health care provider if you are at risk.  If you do not have HIV, but are at risk, it may be recommended that you take a prescription medicine daily to prevent HIV infection. This is called pre-exposure prophylaxis (PrEP). You are considered at risk  if: ? You are sexually active and do not regularly use condoms or know the HIV status of your partner(s). ? You take drugs by injection. ? You are sexually active with a partner who has HIV.  Talk with your health care provider about whether you are at high risk of being infected with HIV. If you choose to begin PrEP, you should first be tested for HIV. You should then be tested every 3 months for as  long as you are taking PrEP. Pregnancy  If you are premenopausal and you may become pregnant, ask your health care provider about preconception counseling.  If you may become pregnant, take 400 to 800 micrograms (mcg) of folic acid every day.  If you want to prevent pregnancy, talk to your health care provider about birth control (contraception). Osteoporosis and menopause  Osteoporosis is a disease in which the bones lose minerals and strength with aging. This can result in serious bone fractures. Your risk for osteoporosis can be identified using a bone density scan.  If you are 24 years of age or older, or if you are at risk for osteoporosis and fractures, ask your health care provider if you should be screened.  Ask your health care provider whether you should take a calcium or vitamin D supplement to lower your risk for osteoporosis.  Menopause may have certain physical symptoms and risks.  Hormone replacement therapy may reduce some of these symptoms and risks. Talk to your health care provider about whether hormone replacement therapy is right for you. Follow these instructions at home:  Schedule regular health, dental, and eye exams.  Stay current with your immunizations.  Do not use any tobacco products including cigarettes, chewing tobacco, or electronic cigarettes.  If you are pregnant, do not drink alcohol.  If you are breastfeeding, limit how much and how often you drink alcohol.  Limit alcohol intake to no more than 1 drink per day for nonpregnant women. One drink  equals 12 ounces of beer, 5 ounces of wine, or 1 ounces of hard liquor.  Do not use street drugs.  Do not share needles.  Ask your health care provider for help if you need support or information about quitting drugs.  Tell your health care provider if you often feel depressed.  Tell your health care provider if you have ever been abused or do not feel safe at home. This information is not intended to replace advice given to you by your health care provider. Make sure you discuss any questions you have with your health care provider. Document Released: 01/03/2011 Document Revised: 11/26/2015 Document Reviewed: 03/24/2015 Elsevier Interactive Patient Education  2018 [employer].    IF you received an x-ray today, you will receive an invoice from [facility]. Please contact [facility] at [phone] with questions or concerns regarding your invoice.   IF you received labwork today, you will receive an invoice from [facility]. Please contact [facility] at [phone] with questions or concerns regarding your invoice.   Our billing staff will not be able to assist you with questions regarding bills from these companies.  You will be contacted with the lab results as soon as they are available. The fastest way to get your results is to activate your My Chart account. Instructions are located on the last page of this paperwork. If you have not heard from [place] regarding the results in 2 weeks, please contact this office.

## 2018-01-13 LAB — CMP14+EGFR
A/G RATIO: 2.3 — AB (ref 1.2–2.2)
ALBUMIN: 4.8 g/dL (ref 3.5–5.5)
ALT: 16 IU/L (ref 0–32)
AST: 15 IU/L (ref 0–40)
Alkaline Phosphatase: 54 IU/L (ref 39–117)
BUN / CREAT RATIO: 16 (ref 9–23)
BUN: 9 mg/dL (ref 6–20)
Bilirubin Total: 1.1 mg/dL (ref 0.0–1.2)
CO2: 24 mmol/L (ref 20–29)
Calcium: 9.7 mg/dL (ref 8.7–10.2)
Chloride: 102 mmol/L (ref 96–106)
Creatinine, Ser: 0.58 mg/dL (ref 0.57–1.00)
GFR calc non Af Amer: 122 mL/min/{1.73_m2} (ref >59)
GFR, EST AFRICAN AMERICAN: 141 mL/min/{1.73_m2} (ref >59)
GLOBULIN, TOTAL: 2.1 g/dL (ref 1.5–4.5)
GLUCOSE: 73 mg/dL (ref 65–99)
Potassium: 4.4 mmol/L (ref 3.5–5.2)
SODIUM: 141 mmol/L (ref 134–144)
TOTAL PROTEIN: 6.9 g/dL (ref 6.0–8.5)

## 2018-01-13 LAB — URINALYSIS, DIPSTICK ONLY
BILIRUBIN UA: NEGATIVE
Glucose, UA: NEGATIVE
KETONES UA: NEGATIVE
LEUKOCYTES UA: NEGATIVE
NITRITE UA: NEGATIVE
PROTEIN UA: NEGATIVE
RBC UA: NEGATIVE
SPEC GRAV UA: 1.011 (ref 1.005–1.030)
Urobilinogen, Ur: 0.2 mg/dL (ref 0.2–1.0)
pH, UA: 8 — ABNORMAL HIGH (ref 5.0–7.5)

## 2018-01-13 LAB — TSH: TSH: 1.55 u[IU]/mL (ref 0.450–4.500)

## 2018-01-13 LAB — LIPID PANEL
CHOL/HDL RATIO: 2.6 ratio (ref 0.0–4.4)
Cholesterol, Total: 173 mg/dL (ref 100–199)
HDL: 66 mg/dL (ref >39)
LDL Calculated: 95 mg/dL (ref 0–99)
Triglycerides: 60 mg/dL (ref 0–149)
VLDL Cholesterol Cal: 12 mg/dL (ref 5–40)

## 2018-01-16 LAB — PAP IG AND HPV HIGH-RISK
HPV, high-risk: NEGATIVE
PAP SMEAR COMMENT: 0

## 2018-02-23 DIAGNOSIS — Z3201 Encounter for pregnancy test, result positive: Secondary | ICD-10-CM | POA: Diagnosis not present

## 2018-02-26 DIAGNOSIS — N926 Irregular menstruation, unspecified: Secondary | ICD-10-CM | POA: Diagnosis not present

## 2018-03-12 DIAGNOSIS — Z3A01 Less than 8 weeks gestation of pregnancy: Secondary | ICD-10-CM | POA: Diagnosis not present

## 2018-03-12 DIAGNOSIS — O26891 Other specified pregnancy related conditions, first trimester: Secondary | ICD-10-CM | POA: Diagnosis not present

## 2018-04-03 DIAGNOSIS — O09291 Supervision of pregnancy with other poor reproductive or obstetric history, first trimester: Secondary | ICD-10-CM | POA: Diagnosis not present

## 2018-04-03 DIAGNOSIS — O021 Missed abortion: Secondary | ICD-10-CM | POA: Diagnosis not present

## 2018-04-03 DIAGNOSIS — Z3A10 10 weeks gestation of pregnancy: Secondary | ICD-10-CM | POA: Diagnosis not present

## 2018-04-09 ENCOUNTER — Encounter (HOSPITAL_COMMUNITY): Payer: Self-pay | Admitting: *Deleted

## 2018-04-09 ENCOUNTER — Other Ambulatory Visit: Payer: Self-pay

## 2018-04-09 NOTE — H&P (Signed)
Jasmine Bell is an 32 y.o. female [phone] presenting for suction D&E missed miscarriage measuring [redacted]w[redacted]d CRL. Pt blood type - A neg.   Pt denies any pain or VB. She had a h/o SAB in 11/18 (6 week missed ab with D&C) and wonders if needs testing for recurrent miscarriages.  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  Pertinent Gynecological History:  OB History:  Vacuum delivery 2017 7#3oz                      Missed ab at 6+ weeks 2018 (D&C)   Menstrual History:  Patient's last menstrual period was 01/22/2018.    Past Medical History:  Diagnosis Date  . Anxiety   . History of infertility   . Infertility, female   . SVD (spontaneous vaginal delivery)    x 1    Past Surgical History:  Procedure Laterality Date  . aerolar cyst on breast removed    . BREAST SURGERY     areolar cyst removal  . DILATION AND EVACUATION  04/12/2016   Procedure: DILATATION AND EVACUATION;  Surgeon: Cheri Fowler, MD;  Location: [facility];  Service: Gynecology;;  . Brigitte Pulse AND EVACUATION N/A 05/17/2017   Procedure: DILATATION AND EVACUATION;  Surgeon: Paula Compton, MD;  Location: [facility];  Service: Gynecology;  Laterality: N/A;  . WISDOM TOOTH EXTRACTION      Family History  Problem Relation Age of Onset  . Depression Father   . Mental illness Father   . Breast cancer Maternal Aunt   . Diabetes Paternal Aunt   . Testicular cancer Maternal Grandfather   . Bipolar disorder Paternal Grandfather   . Heart disease Paternal Grandfather   . Hyperlipidemia Paternal Grandfather   . Cancer Maternal Grandmother   . Stroke Maternal Grandmother   . Mental illness Maternal Grandmother     Social History:  reports that she has never smoked. She has never used smokeless tobacco. She reports that she drinks alcohol. She reports that she does not use drugs.  Allergies: No Known Allergies  No medications prior to admission.    Review of Systems   Gastrointestinal: Negative for abdominal pain and nausea.    Height 5\' 2"  (1.575 m), weight 56.7 kg, last menstrual period 01/22/2018. Physical Exam  Constitutional: She appears well-developed.  Cardiovascular: Normal rate and regular rhythm.  Respiratory: Effort normal.  GI: Soft.  Genitourinary: Vagina normal.  Neurological: She is alert.  Psychiatric: She has a normal mood and affect.    No results found for this or any previous visit (from the past 24 hour(s)).  No results found.  Assessment/Plan: D/w pt the risks and benefits of D&C including bleeding and infection, as well as uterine perforation.  She will use cytotec prior to procedure to reduce this risk.  We will also look into performing anora on the pregnancy tissue for diagnostic purposes, since this is her second miscarriage in a row.   She will get rhogam prior to d/c for her Rh negative status.  Logan Bores 04/09/2018, 8:12 PM

## 2018-04-10 ENCOUNTER — Ambulatory Visit (HOSPITAL_COMMUNITY)
Admission: RE | Admit: 2018-04-10 | Discharge: 2018-04-10 | Disposition: A | Discharge status: Home / Self Care | Payer: 59 | Source: Ambulatory Visit | Attending: Obstetrics and Gynecology | Admitting: Obstetrics and Gynecology

## 2018-04-10 ENCOUNTER — Ambulatory Visit (HOSPITAL_COMMUNITY): Payer: 59 | Admitting: Certified Registered Nurse Anesthetist

## 2018-04-10 ENCOUNTER — Encounter (HOSPITAL_COMMUNITY)
Admission: RE | Disposition: A | Discharge status: Home / Self Care | Payer: Self-pay | Source: Ambulatory Visit | Attending: Obstetrics and Gynecology

## 2018-04-10 ENCOUNTER — Encounter (HOSPITAL_COMMUNITY): Payer: Self-pay

## 2018-04-10 DIAGNOSIS — O021 Missed abortion: Secondary | ICD-10-CM | POA: Insufficient documentation

## 2018-04-10 DIAGNOSIS — N979 Female infertility, unspecified: Secondary | ICD-10-CM | POA: Insufficient documentation

## 2018-04-10 HISTORY — PX: DILATION AND EVACUATION: SHX1459

## 2018-04-10 HISTORY — DX: Female infertility, unspecified: N97.9

## 2018-04-10 LAB — CBC
HCT: 40.1 % (ref 36.0–46.0)
Hemoglobin: 13.7 g/dL (ref 12.0–15.0)
MCH: 32 pg (ref 26.0–34.0)
MCHC: 34.2 g/dL (ref 30.0–36.0)
MCV: 93.7 fL (ref 80.0–100.0)
Platelets: 190 10*3/uL (ref 150–400)
RBC: 4.28 MIL/uL (ref 3.87–5.11)
RDW: 12.8 % (ref 11.5–15.5)
WBC: 5.5 10*3/uL (ref 4.0–10.5)
nRBC: 0 % (ref 0.0–0.2)

## 2018-04-10 SURGERY — DILATION AND EVACUATION, UTERUS
Anesthesia: Monitor Anesthesia Care | Site: Vagina

## 2018-04-10 MED ORDER — ACETAMINOPHEN 500 MG PO TABS
ORAL_TABLET | ORAL | Status: AC
  Administered 2018-04-10: 1000 mg via ORAL
  Filled 2018-04-10: qty 2

## 2018-04-10 MED ORDER — RHO D IMMUNE GLOBULIN 1500 UNIT/2ML IJ SOSY
300.0000 ug | PREFILLED_SYRINGE | Freq: Once | INTRAMUSCULAR | Status: AC
  Administered 2018-04-10: 300 ug via INTRAVENOUS
  Filled 2018-04-10: qty 2

## 2018-04-10 MED ORDER — ONDANSETRON HCL 4 MG/2ML IJ SOLN
INTRAMUSCULAR | Status: AC
  Filled 2018-04-10: qty 2

## 2018-04-10 MED ORDER — SCOPOLAMINE 1 MG/3DAYS TD PT72
1.0000 | MEDICATED_PATCH | Freq: Once | TRANSDERMAL | Status: DC
  Administered 2018-04-10: 1.5 mg via TRANSDERMAL

## 2018-04-10 MED ORDER — DEXAMETHASONE SODIUM PHOSPHATE 10 MG/ML IJ SOLN
INTRAMUSCULAR | Status: DC | PRN
  Administered 2018-04-10: 4 mg via INTRAVENOUS

## 2018-04-10 MED ORDER — MIDAZOLAM HCL 2 MG/2ML IJ SOLN
INTRAMUSCULAR | Status: AC
  Filled 2018-04-10: qty 2

## 2018-04-10 MED ORDER — PROMETHAZINE HCL 25 MG/ML IJ SOLN: 6.2500 mg | INTRAMUSCULAR | Status: DC | PRN

## 2018-04-10 MED ORDER — LACTATED RINGERS IV SOLN
INTRAVENOUS | Status: DC
  Administered 2018-04-10: 100 mL/h via INTRAVENOUS

## 2018-04-10 MED ORDER — FENTANYL CITRATE (PF) 100 MCG/2ML IJ SOLN
INTRAMUSCULAR | Status: AC
  Filled 2018-04-10: qty 2

## 2018-04-10 MED ORDER — KETOROLAC TROMETHAMINE 30 MG/ML IJ SOLN
INTRAMUSCULAR | Status: AC
  Filled 2018-04-10: qty 1

## 2018-04-10 MED ORDER — LIDOCAINE HCL (PF) 1 % IJ SOLN
INTRAMUSCULAR | Status: AC
  Filled 2018-04-10: qty 5

## 2018-04-10 MED ORDER — LIDOCAINE HCL (CARDIAC) PF 100 MG/5ML IV SOSY
PREFILLED_SYRINGE | INTRAVENOUS | Status: DC | PRN
  Administered 2018-04-10: 50 mg via INTRAVENOUS

## 2018-04-10 MED ORDER — KETOROLAC TROMETHAMINE 30 MG/ML IJ SOLN
INTRAMUSCULAR | Status: DC | PRN
  Administered 2018-04-10: 30 mg via INTRAVENOUS

## 2018-04-10 MED ORDER — LIDOCAINE HCL 1 % IJ SOLN
INTRAMUSCULAR | Status: AC
  Filled 2018-04-10: qty 20

## 2018-04-10 MED ORDER — PROPOFOL 500 MG/50ML IV EMUL
INTRAVENOUS | Status: DC | PRN
  Administered 2018-04-10: 10 mg via INTRAVENOUS
  Administered 2018-04-10 (×4): 20 mg via INTRAVENOUS
  Administered 2018-04-10: 70 mg via INTRAVENOUS
  Administered 2018-04-10: 20 mg via INTRAVENOUS

## 2018-04-10 MED ORDER — LIDOCAINE HCL 1 % IJ SOLN
INTRAMUSCULAR | Status: DC | PRN
  Administered 2018-04-10: 20 mL

## 2018-04-10 MED ORDER — SCOPOLAMINE 1 MG/3DAYS TD PT72
MEDICATED_PATCH | TRANSDERMAL | Status: AC
  Administered 2018-04-10: 1.5 mg via TRANSDERMAL
  Filled 2018-04-10: qty 1

## 2018-04-10 MED ORDER — PROPOFOL 10 MG/ML IV BOLUS
INTRAVENOUS | Status: AC
  Filled 2018-04-10: qty 40

## 2018-04-10 MED ORDER — ONDANSETRON HCL 4 MG/2ML IJ SOLN
INTRAMUSCULAR | Status: DC | PRN
  Administered 2018-04-10: 4 mg via INTRAVENOUS

## 2018-04-10 MED ORDER — ACETAMINOPHEN 500 MG PO TABS
1000.0000 mg | ORAL_TABLET | Freq: Once | ORAL | Status: AC
  Administered 2018-04-10: 1000 mg via ORAL

## 2018-04-10 MED ORDER — FENTANYL CITRATE (PF) 100 MCG/2ML IJ SOLN: 25.0000 ug | INTRAMUSCULAR | Status: DC | PRN

## 2018-04-10 MED ORDER — FENTANYL CITRATE (PF) 100 MCG/2ML IJ SOLN
INTRAMUSCULAR | Status: DC | PRN
  Administered 2018-04-10: 25 ug via INTRAVENOUS
  Administered 2018-04-10: 50 ug via INTRAVENOUS
  Administered 2018-04-10: 25 ug via INTRAVENOUS

## 2018-04-10 MED ORDER — MIDAZOLAM HCL 2 MG/2ML IJ SOLN
INTRAMUSCULAR | Status: DC | PRN
  Administered 2018-04-10: 2 mg via INTRAVENOUS

## 2018-04-10 SURGICAL SUPPLY — 18 items
CATH ROBINSON RED A/P 16FR (CATHETERS) ×3 IMPLANT
DECANTER SPIKE VIAL GLASS SM (MISCELLANEOUS) ×3 IMPLANT
GLOVE BIO SURGEON STRL SZ 6.5 (GLOVE) ×2 IMPLANT
GLOVE BIO SURGEONS STRL SZ 6.5 (GLOVE) ×1
GLOVE BIOGEL PI IND STRL 7.0 (GLOVE) ×1 IMPLANT
GLOVE BIOGEL PI INDICATOR 7.0 (GLOVE) ×2
GOWN STRL REUS W/TWL LRG LVL3 (GOWN DISPOSABLE) ×6 IMPLANT
KIT BERKELEY 1ST TRIMESTER 3/8 (MISCELLANEOUS) ×3 IMPLANT
NS IRRIG 1000ML POUR BTL (IV SOLUTION) ×3 IMPLANT
PACK VAGINAL MINOR WOMEN LF (CUSTOM PROCEDURE TRAY) ×3 IMPLANT
PAD OB MATERNITY 4.3X12.25 (PERSONAL CARE ITEMS) ×3 IMPLANT
PAD PREP 24X48 CUFFED NSTRL (MISCELLANEOUS) ×3 IMPLANT
SET BERKELEY SUCTION TUBING (SUCTIONS) ×3 IMPLANT
TOWEL OR 17X24 6PK STRL BLUE (TOWEL DISPOSABLE) ×6 IMPLANT
VACURETTE 10 RIGID CVD (CANNULA) IMPLANT
VACURETTE 7MM CVD STRL WRAP (CANNULA) IMPLANT
VACURETTE 8 RIGID CVD (CANNULA) ×3 IMPLANT
VACURETTE 9 RIGID CVD (CANNULA) IMPLANT

## 2018-04-10 NOTE — Anesthesia Preprocedure Evaluation (Signed)
Anesthesia Evaluation  Patient identified by MRN, date of birth, ID band Patient awake    Reviewed: Allergy & Precautions, NPO status , Patient's Chart, lab work & pertinent test results  Airway Mallampati: II  TM Distance: >3 FB Neck ROM: Full    Dental  (+) Teeth Intact, Dental Advisory Given   Pulmonary neg pulmonary ROS,    Pulmonary exam normal breath sounds clear to auscultation       Cardiovascular Exercise Tolerance: Good negative cardio ROS Normal cardiovascular exam Rhythm:Regular Rate:Normal     Neuro/Psych PSYCHIATRIC DISORDERS Anxiety negative neurological ROS     GI/Hepatic negative GI ROS, Neg liver ROS,   Endo/Other  negative endocrine ROS  Renal/GU negative Renal ROS     Musculoskeletal negative musculoskeletal ROS (+)   Abdominal   Peds  Hematology negative hematology ROS (+)   Anesthesia Other Findings Day of surgery medications reviewed with the patient.  Reproductive/Obstetrics Missed Abortion 9 weeks                             Anesthesia Physical Anesthesia Plan  ASA: II  Anesthesia Plan: MAC   Post-op Pain Management:    Induction: Intravenous  PONV Risk Score and Plan: 2 and Propofol infusion, Midazolam and Ondansetron  Airway Management Planned: Natural Airway and Simple Face Mask  Additional Equipment:   Intra-op Plan:   Post-operative Plan:   Informed Consent: I have reviewed the patients History and Physical, chart, labs and discussed the procedure including the risks, benefits and alternatives for the proposed anesthesia with the patient or authorized representative who has indicated his/her understanding and acceptance.   Dental advisory given  Plan Discussed with: CRNA and Anesthesiologist  Anesthesia Plan Comments: (Discussed risks/benefits/alternatives to MAC sedation including need for ventilatory support, hypotension, need for  conversion to general anesthesia.  All patient questions answered.  Patient/guardian wishes to proceed.)        Anesthesia Quick Evaluation

## 2018-04-10 NOTE — Anesthesia Postprocedure Evaluation (Signed)
Anesthesia Post Note  Patient: LORRIANE DEHART  Procedure(s) Performed: DILATATION AND EVACUATION (N/A Vagina )     Patient location during evaluation: PACU Anesthesia Type: MAC Level of consciousness: awake and alert, oriented and awake Pain management: pain level controlled Vital Signs Assessment: post-procedure vital signs reviewed and stable Respiratory status: spontaneous breathing, nonlabored ventilation and respiratory function stable Cardiovascular status: stable and blood pressure returned to baseline Postop Assessment: no apparent nausea or vomiting Anesthetic complications: no    Last Vitals:  Vitals:   04/10/18 1200 04/10/18 1215  BP: (!) 93/57 (!) 101/59  Pulse:  68  Resp:  14  Temp:  36.7 C  SpO2:  100%    Last Pain:  Vitals:   04/10/18 1145  TempSrc:   PainSc: 0-No pain   Pain Goal: Patients Stated Pain Goal: 1 (04/10/18 1145)               Catalina Gravel

## 2018-04-10 NOTE — Op Note (Signed)
Operative Note    Preoperative Diagnosis Missed miscarriage at 9+ weeks  Postoperative Diagnosis same  Procedure Suction Dilation and Evacuation  Surgeon Paula Compton, MD  Anesthesia LMA  Fluids: EBL 77m UOP 578mstraight cath prior IVF 110067mFindings Uterus sounds 9cm and moderate POC's obtained  Specimen Part sent to pathology, and majority collected in the Anora kit  Procedure Note Pt was taken to operating room where LMA anesthesia was obtained without difficulty.  She was prepped and draped in the normal sterile fashion in the dorsal lithotomy position.  An appropriate timeout was performed.  A speculum was placed in the vagina and 2cc of 1% plain lidocaine injected into the anterior cervix.  An additional 9cc was placed both at 2 and 10 o'clock for a paracervical block.  A single tooth tenaculum was placed on the anterior lip of the cervix. The cervix was slightly dilated from the pt's cytotec treatment and  sound was easily introduced into the uterus and the uterus sounded to about 9cm.  The 8mm18mction currette was obtained and introduced into the fundus.  With several passes, a moderate amount of tissue was obtained.  When no further tissue was noted, the suction was discontinued and a gentle currettage performed.  One additional pass revealed no further tissue, and the procedure was completed.  The tenaculum was removed and silver nitrate used for hemostasis.  All instruments and sponges were removed from vagina and the patient awakened and taken to the PACU in good condition.

## 2018-04-10 NOTE — Progress Notes (Signed)
Patient ID: Jasmine Bell, female   DOB: 07/18/1985, 32 y.o.   MRN: [id number] Per pt no changes in dictated H&P.  Brief exam WNL.  Anora test kit here and will collect tissue and blood and bring to office for processing

## 2018-04-10 NOTE — Transfer of Care (Signed)
Immediate Anesthesia Transfer of Care Note  Patient: Jasmine Bell  Procedure(s) Performed: DILATATION AND EVACUATION (N/A Vagina )  Patient Location: PACU  Anesthesia Type:MAC  Level of Consciousness: awake, alert  and oriented  Airway & Oxygen Therapy: Patient Spontanous Breathing and Patient connected to nasal cannula oxygen  Post-op Assessment: Report given to RN and Post -op Vital signs reviewed and stable  Post vital signs: Reviewed and stable  Last Vitals:  Vitals Value Taken Time  BP    Temp    Pulse 78 04/10/2018 11:05 AM  Resp 12 04/10/2018 11:05 AM  SpO2 99 % 04/10/2018 11:05 AM  Vitals shown include unvalidated device data.  Last Pain:  Vitals:   04/10/18 0944  TempSrc: Oral  PainSc: 1       Patients Stated Pain Goal: 1 (04/27/84 2778)  Complications: No apparent anesthesia complications

## 2018-04-10 NOTE — Discharge Instructions (Signed)
DISCHARGE INSTRUCTIONS: D&C / D&E The following instructions have been prepared to help you care for yourself upon your return home.   Personal hygiene:  Use sanitary pads for vaginal drainage, not tampons.  Shower the day after your procedure.  NO tub baths, pools or Jacuzzis for 2-3 weeks.  Wipe front to back after using the bathroom.  Activity and limitations:  Do NOT drive or operate any equipment for 24 hours. The effects of anesthesia are still present and drowsiness may result.  Do NOT rest in bed all day.  Walking is encouraged.  Walk up and down stairs slowly.  You may resume your normal activity in one to two days or as indicated by your physician.  Sexual activity: NO intercourse for at least 2 weeks after the procedure, or as indicated by your physician.  Diet: Eat a light meal as desired this evening. You may resume your usual diet tomorrow.  Return to work: You may resume your work activities in one to two days or as indicated by your doctor.  What to expect after your surgery: Expect to have vaginal bleeding/discharge for 2-3 days and spotting for up to 10 days. It is not unusual to have soreness for up to 1-2 weeks. You may have a slight burning sensation when you urinate for the first day. Mild cramps may continue for a couple of days. You may have a regular period in 2-6 weeks.  Call your doctor for any of the following:  Excessive vaginal bleeding, saturating and changing one pad every hour.  Inability to urinate 6 hours after discharge from hospital.  Pain not relieved by pain medication.  Fever of 100.4 F or greater.  Unusual vaginal discharge or odor.   Call for an appointment:    Patients signature: ______________________  Nurses signature ________________________  Support person's signature_______________________   Post Anesthesia Home Care Instructions NO IBUPROFEN PRODUCTS UNTIL: 4:45 PM TODAY  Activity: Get plenty of rest for  the remainder of the day. A responsible individual must stay with you for 24 hours following the procedure.  For the next 24 hours, DO NOT: -Drive a car -Paediatric nurse -Drink alcoholic beverages -Take any medication unless instructed by your physician -Make any legal decisions or sign important papers.  Meals: Start with liquid foods such as gelatin or soup. Progress to regular foods as tolerated. Avoid greasy, spicy, heavy foods. If nausea and/or vomiting occur, drink only clear liquids until the nausea and/or vomiting subsides. Call your physician if vomiting continues.  Special Instructions/Symptoms: Your throat may feel dry or sore from the anesthesia or the breathing tube placed in your throat during surgery. If this causes discomfort, gargle with warm salt water. The discomfort should disappear within 24 hours.  If you had a scopolamine patch placed behind your ear for the management of post- operative nausea and/or vomiting:  1. The medication in the patch is effective for 72 hours, after which it should be removed.  Wrap patch in a tissue and discard in the trash. Wash hands thoroughly with soap and water. 2. You may remove the patch earlier than 72 hours if you experience unpleasant side effects which may include dry mouth, dizziness or visual disturbances. 3. Avoid touching the patch. Wash your hands with soap and water after contact with the patch.

## 2018-04-11 ENCOUNTER — Encounter (HOSPITAL_COMMUNITY): Payer: Self-pay | Admitting: Obstetrics and Gynecology

## 2018-04-11 LAB — RH IG WORKUP (INCLUDES ABO/RH)
ABO/RH(D): O NEG
ANTIBODY SCREEN: NEGATIVE
Gestational Age(Wks): 9
Unit division: 0

## 2018-06-25 ENCOUNTER — Other Ambulatory Visit: Payer: Self-pay

## 2018-06-25 ENCOUNTER — Encounter: Payer: Self-pay | Admitting: Family Medicine

## 2018-06-25 ENCOUNTER — Ambulatory Visit: Payer: 59 | Admitting: Family Medicine

## 2018-06-25 VITALS — BP 106/71 | HR 97 | Temp 100.0°F | Resp 18 | Ht 62.0 in | Wt 129.8 lb

## 2018-06-25 DIAGNOSIS — J029 Acute pharyngitis, unspecified: Secondary | ICD-10-CM

## 2018-06-25 DIAGNOSIS — R6889 Other general symptoms and signs: Secondary | ICD-10-CM | POA: Diagnosis not present

## 2018-06-25 LAB — POCT RAPID STREP A (OFFICE): RAPID STREP A SCREEN: NEGATIVE

## 2018-06-25 LAB — POCT INFLUENZA A/B
Influenza A, POC: NEGATIVE
Influenza B, POC: NEGATIVE

## 2018-06-25 MED ORDER — HYDROCODONE-HOMATROPINE 5-1.5 MG/5ML PO SYRP: 5.0000 mL | ORAL_SOLUTION | ORAL | 0 refills | Status: DC | PRN

## 2018-06-25 MED ORDER — BENZONATATE 100 MG PO CAPS: 100.0000 mg | ORAL_CAPSULE | Freq: Three times a day (TID) | ORAL | 0 refills | Status: DC | PRN

## 2018-06-25 NOTE — Progress Notes (Signed)
Patient ID: Merdis Delay, female    DOB: [birth year]  Age: 32 y.o. MRN: [id number]  Chief Complaint  Patient presents with  . Fever    started thursday   . Sore Throat  . Cough    Subjective:   Patient has been sick since Thursday with a sore throat and cough and fever.  The fever was up to 102.  She has felt bad.  She has had to work today despite the illness.  She does not smoke.  She has had a flu shot.  Work as a Music therapist.  Married.  Current allergies, medications, problem list, past/family and social histories reviewed.  Objective:  BP 106/71   Pulse 97   Temp 100 F (37.8 C) (Oral)   Resp 18   Ht 5\' 2"  (1.575 m)   Wt 129 lb 12.8 oz (58.9 kg)   LMP 01/21/2018 Comment: approx 9 wks  SpO2 98%   Breastfeeding Unknown   BMI 23.74 kg/m   No major distress.  TMs normal.  Throat not erythematous.  Neck supple without nodes.  Chest is clear to auscultation.  Heart regular without murmur.  Assessment & Plan:   Assessment: 1. Flu-like symptoms   2. Sore throat       Plan: See instructions Results for orders placed or performed in visit on 06/25/18  POCT rapid strep A  Result Value Ref Range   Rapid Strep A Screen Negative Negative  POCT Influenza A/B  Result Value Ref Range   Influenza A, POC Negative Negative   Influenza B, POC Negative Negative     Orders Placed This Encounter  Procedures  . POCT rapid strep A  . POCT Influenza A/B    Meds ordered this encounter  Medications  . benzonatate (TESSALON) 100 MG capsule    Sig: Take 1-2 capsules (100-200 mg total) by mouth 3 (three) times daily as needed.    Dispense:  30 capsule    Refill:  0  . HYDROcodone-homatropine (HYCODAN) 5-1.5 MG/5ML syrup    Sig: Take 5 mLs by mouth every 4 (four) hours as needed.    Dispense:  120 mL    Refill:  0         Patient Instructions    Drink plenty of fluids  Get enough rest  Take Tylenol or ibuprofen if needed for pain  Hycodan 1 teaspoon  every 4-6 hours as needed  Benzonatate cough pills can be taken when you are trying to work and do not need a sedative.  Return if worse   If you have lab work done today you will be contacted with your lab results within the next 2 weeks.  If you have not heard from [place] then please contact us. The fastest way to get your results is to register for My Chart.   IF you received an x-ray today, you will receive an invoice from [facility]. Please contact [facility] at [phone] with questions or concerns regarding your invoice.   IF you received labwork today, you will receive an invoice from [place]. Please contact [facility] at [phone] with questions or concerns regarding your invoice.   Our billing staff will not be able to assist you with questions regarding bills from these companies.  You will be contacted with the lab results as soon as they are available. The fastest way to get your results is to activate your My Chart account. Instructions are located on the last page of this paperwork. If  you have not heard from [place] regarding the results in 2 weeks, please contact this office.        Return if symptoms worsen or fail to improve.   Ruben Reason, MD 06/25/2018

## 2018-06-25 NOTE — Patient Instructions (Addendum)
  Drink plenty of fluids  Get enough rest  Take Tylenol or ibuprofen if needed for pain  Hycodan 1 teaspoon every 4-6 hours as needed  Benzonatate cough pills can be taken when you are trying to work and do not need a sedative.  Return if worse   If you have lab work done today you will be contacted with your lab results within the next 2 weeks.  If you have not heard from [place] then please contact us. The fastest way to get your results is to register for My Chart.   IF you received an x-ray today, you will receive an invoice from [facility]. Please contact [facility] at [phone] with questions or concerns regarding your invoice.   IF you received labwork today, you will receive an invoice from [place]. Please contact [facility] at [phone] with questions or concerns regarding your invoice.   Our billing staff will not be able to assist you with questions regarding bills from these companies.  You will be contacted with the lab results as soon as they are available. The fastest way to get your results is to activate your My Chart account. Instructions are located on the last page of this paperwork. If you have not heard from [place] regarding the results in 2 weeks, please contact this office.

## 2019-03-04 LAB — OB RESULTS CONSOLE GBS: GBS: POSITIVE

## 2019-03-04 LAB — OB RESULTS CONSOLE ANTIBODY SCREEN: Antibody Screen: NEGATIVE

## 2019-03-04 LAB — OB RESULTS CONSOLE GC/CHLAMYDIA
Chlamydia: NEGATIVE
Gonorrhea: NEGATIVE

## 2019-03-04 LAB — OB RESULTS CONSOLE HEPATITIS B SURFACE ANTIGEN: Hepatitis B Surface Ag: NEGATIVE

## 2019-03-04 LAB — OB RESULTS CONSOLE RPR: RPR: NONREACTIVE

## 2019-03-04 LAB — OB RESULTS CONSOLE HIV ANTIBODY (ROUTINE TESTING): HIV: NONREACTIVE

## 2019-03-04 LAB — OB RESULTS CONSOLE ABO/RH: RH Type: NEGATIVE

## 2019-03-04 LAB — OB RESULTS CONSOLE RUBELLA ANTIBODY, IGM: Rubella: IMMUNE

## 2019-09-16 ENCOUNTER — Telehealth (HOSPITAL_COMMUNITY): Payer: Self-pay | Admitting: *Deleted

## 2019-09-16 ENCOUNTER — Encounter (HOSPITAL_COMMUNITY): Payer: Self-pay | Admitting: *Deleted

## 2019-09-16 NOTE — Telephone Encounter (Signed)
Preadmission screen  

## 2019-09-19 ENCOUNTER — Encounter (HOSPITAL_COMMUNITY): Payer: Self-pay | Admitting: *Deleted

## 2019-09-19 ENCOUNTER — Telehealth (HOSPITAL_COMMUNITY): Payer: Self-pay | Admitting: *Deleted

## 2019-09-19 NOTE — Telephone Encounter (Signed)
Preadmission screen  

## 2019-09-21 ENCOUNTER — Other Ambulatory Visit (HOSPITAL_COMMUNITY)
Admission: RE | Admit: 2019-09-21 | Discharge: 2019-09-21 | Disposition: A | Discharge status: Home / Self Care | Payer: 59 | Source: Ambulatory Visit | Attending: Obstetrics and Gynecology | Admitting: Obstetrics and Gynecology

## 2019-09-21 DIAGNOSIS — Z01812 Encounter for preprocedural laboratory examination: Secondary | ICD-10-CM | POA: Insufficient documentation

## 2019-09-21 DIAGNOSIS — Z20822 Contact with and (suspected) exposure to covid-19: Secondary | ICD-10-CM | POA: Insufficient documentation

## 2019-09-21 LAB — SARS CORONAVIRUS 2 (TAT 6-24 HRS): SARS Coronavirus 2: NEGATIVE

## 2019-09-22 ENCOUNTER — Other Ambulatory Visit: Payer: Self-pay | Admitting: Obstetrics and Gynecology

## 2019-09-22 NOTE — H&P (Deleted)
  The note originally documented on this encounter has been moved the the encounter in which it belongs.  

## 2019-09-22 NOTE — H&P (Signed)
Jasmine Bell is a 34 y.o. female [id number] at 10 3/7 weeks (EDD 09/27/19 by known [place] with IVF pregnancy) presenting for IOL at term.   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 She had a diagnosis of GDM at 34 weeks when her 3 hour GTT was repeated for one abnormal value at 28 weeks.  She has been very well-controlled on diet.  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    OB History    Gravida  4   Para  1   Term  1   Preterm      AB  2   Living  1     SAB  2   TAB      Ectopic      Multiple  0   Live Births  1         03-15-2016, 38.4 wks M, 7lbs 3oz, Vacuum Extraction SAB x 2  Past Medical History:  Diagnosis Date  . Anxiety   . Gestational diabetes   . History of infertility   . Infertility, female   . Newborn product of IVF pregnancy   . SVD (spontaneous vaginal delivery)    x 1   Past Surgical History:  Procedure Laterality Date  . aerolar cyst on breast removed    . BREAST SURGERY     areolar cyst removal  . DILATION AND CURETTAGE OF UTERUS    . DILATION AND EVACUATION  04/12/2016   Procedure: DILATATION AND EVACUATION;  Surgeon: Cheri Fowler, MD;  Location: [facility];  Service: Gynecology;;  . Brigitte Pulse AND EVACUATION N/A 05/17/2017   Procedure: DILATATION AND EVACUATION;  Surgeon: Paula Compton, MD;  Location: [facility];  Service: Gynecology;  Laterality: N/A;  . DILATION AND EVACUATION N/A 04/10/2018   Procedure: DILATATION AND EVACUATION;  Surgeon: Paula Compton, MD;  Location: [facility];  Service: Gynecology;  Laterality: N/A;  . WISDOM TOOTH EXTRACTION     Family History: family history includes Bipolar disorder in her paternal grandfather; Breast cancer in her maternal aunt; Cancer in her maternal grandmother; Depression in her father; Diabetes in her paternal aunt; Heart disease in her paternal grandfather; Hyperlipidemia in her paternal grandfather; Mental illness in her father and maternal grandmother;  Stroke in her maternal grandmother; Testicular cancer in her maternal grandfather. Social History:  reports that she has never smoked. She has never used smokeless tobacco. She reports current alcohol use. She reports that she does not use drugs.     Maternal Diabetes: Yes:  Diabetes Type:  Diet controlled Genetic Screening: Normal Maternal Ultrasounds/Referrals: Normal Fetal Ultrasounds or other Referrals:  None Maternal Substance Abuse:  No Significant Maternal Medications:  None Significant Maternal Lab Results:  Group B Strep positive Other Comments:  None  Review of Systems  Constitutional: Negative for fever.  Gastrointestinal: Negative for abdominal pain.   Maternal Medical History:  Contractions: Frequency: irregular.   Perceived severity is mild.    Fetal activity: Perceived fetal activity is normal.    Prenatal complications: IVF pregnancy, GBS positive, GDM  Prenatal Complications - Diabetes: gestational. Diabetes is managed by diet.        unknown if currently breastfeeding. Maternal Exam:  Uterine Assessment: Contraction strength is mild.  Contraction frequency is irregular.   Abdomen: Patient reports no abdominal tenderness. Estimated fetal weight is 8 1/2 lbs.   Fetal presentation: vertex  Introitus: Normal vulva. Normal vagina.  Pelvis: adequate for delivery.      Physical Exam  Constitutional: She appears well-developed.  Cardiovascular: Normal rate and regular rhythm.  Respiratory: Effort normal.  GI: Soft.  Genitourinary:    Vulva normal.   Musculoskeletal:        General: Normal range of motion.  Neurological: She is alert.  Psychiatric: She has a normal mood and affect.    Prenatal labs: ABO, Rh: O/Negative/-- (08/31 0000) Antibody: Negative (08/31 0000) Rubella: Immune (08/31 0000) RPR: Nonreactive (08/31 0000)  HBsAg: Negative (08/31 0000)  HIV: Non-reactive (08/31 0000)  GBS: Positive/-- (08/31 0000)  One hour GCT 174  Essential  panel negative 2016  Assessment/Plan: Pt for IOL at term, IVF pregnancy Plan PCN for +GBS carrier status. Pitocin and then AROM once PCN on board.   Epidural prn   Logan Bores 09/22/2019, 9:11 AM

## 2019-09-23 ENCOUNTER — Inpatient Hospital Stay (HOSPITAL_COMMUNITY): Payer: 59 | Admitting: Anesthesiology

## 2019-09-23 ENCOUNTER — Inpatient Hospital Stay (HOSPITAL_COMMUNITY)
Admission: AD | Admit: 2019-09-23 | Discharge: 2019-09-25 | DRG: 787 | Disposition: A | Discharge status: Home / Self Care | Payer: 59 | Attending: Obstetrics and Gynecology | Admitting: Obstetrics and Gynecology

## 2019-09-23 ENCOUNTER — Inpatient Hospital Stay (HOSPITAL_COMMUNITY): Admission: AD | Admit: 2019-09-23 | Payer: 59 | Source: Home / Self Care

## 2019-09-23 ENCOUNTER — Inpatient Hospital Stay (HOSPITAL_COMMUNITY): Payer: 59

## 2019-09-23 ENCOUNTER — Encounter (HOSPITAL_COMMUNITY): Payer: Self-pay | Admitting: Obstetrics and Gynecology

## 2019-09-23 ENCOUNTER — Encounter (HOSPITAL_COMMUNITY): Payer: Self-pay

## 2019-09-23 ENCOUNTER — Encounter (HOSPITAL_COMMUNITY)
Admission: AD | Disposition: A | Discharge status: Home / Self Care | Payer: Self-pay | Source: Home / Self Care | Attending: Obstetrics and Gynecology

## 2019-09-23 ENCOUNTER — Other Ambulatory Visit: Payer: Self-pay

## 2019-09-23 DIAGNOSIS — Z3A39 39 weeks gestation of pregnancy: Secondary | ICD-10-CM

## 2019-09-23 DIAGNOSIS — O99824 Streptococcus B carrier state complicating childbirth: Secondary | ICD-10-CM | POA: Diagnosis present

## 2019-09-23 DIAGNOSIS — D62 Acute posthemorrhagic anemia: Secondary | ICD-10-CM | POA: Diagnosis not present

## 2019-09-23 DIAGNOSIS — O24420 Gestational diabetes mellitus in childbirth, diet controlled: Secondary | ICD-10-CM | POA: Diagnosis present

## 2019-09-23 DIAGNOSIS — Z6791 Unspecified blood type, Rh negative: Secondary | ICD-10-CM | POA: Diagnosis not present

## 2019-09-23 DIAGNOSIS — O9081 Anemia of the puerperium: Secondary | ICD-10-CM | POA: Diagnosis not present

## 2019-09-23 DIAGNOSIS — Z20822 Contact with and (suspected) exposure to covid-19: Secondary | ICD-10-CM | POA: Diagnosis present

## 2019-09-23 DIAGNOSIS — O26893 Other specified pregnancy related conditions, third trimester: Secondary | ICD-10-CM | POA: Diagnosis present

## 2019-09-23 DIAGNOSIS — Z98891 History of uterine scar from previous surgery: Secondary | ICD-10-CM

## 2019-09-23 LAB — CBC
HCT: 39.4 % (ref 36.0–46.0)
Hemoglobin: 13.4 g/dL (ref 12.0–15.0)
MCH: 33.3 pg (ref 26.0–34.0)
MCHC: 34 g/dL (ref 30.0–36.0)
MCV: 97.8 fL (ref 80.0–100.0)
Platelets: 169 10*3/uL (ref 150–400)
RBC: 4.03 MIL/uL (ref 3.87–5.11)
RDW: 13.2 % (ref 11.5–15.5)
WBC: 9.1 10*3/uL (ref 4.0–10.5)
nRBC: 0 % (ref 0.0–0.2)

## 2019-09-23 LAB — RPR: RPR Ser Ql: NONREACTIVE

## 2019-09-23 LAB — GLUCOSE, CAPILLARY
Glucose-Capillary: 67 mg/dL — ABNORMAL LOW (ref 70–99)
Glucose-Capillary: 94 mg/dL (ref 70–99)
Glucose-Capillary: 102 mg/dL — ABNORMAL HIGH (ref 70–99)

## 2019-09-23 SURGERY — Surgical Case
Anesthesia: Epidural

## 2019-09-23 MED ORDER — FENTANYL CITRATE (PF) 100 MCG/2ML IJ SOLN
INTRAMUSCULAR | Status: AC
  Filled 2019-09-23: qty 2

## 2019-09-23 MED ORDER — IBUPROFEN 800 MG PO TABS
800.0000 mg | ORAL_TABLET | Freq: Three times a day (TID) | ORAL | Status: DC
  Administered 2019-09-23 – 2019-09-25 (×6): 800 mg via ORAL
  Filled 2019-09-23 (×6): qty 1

## 2019-09-23 MED ORDER — PROPOFOL 10 MG/ML IV BOLUS
INTRAVENOUS | Status: AC
  Filled 2019-09-23: qty 20

## 2019-09-23 MED ORDER — LACTATED RINGERS IV SOLN: INTRAVENOUS | Status: DC

## 2019-09-23 MED ORDER — ZOLPIDEM TARTRATE 5 MG PO TABS: 5.0000 mg | ORAL_TABLET | Freq: Every evening | ORAL | Status: DC | PRN

## 2019-09-23 MED ORDER — SIMETHICONE 80 MG PO CHEW
80.0000 mg | CHEWABLE_TABLET | ORAL | Status: DC | PRN
  Administered 2019-09-25: 80 mg via ORAL

## 2019-09-23 MED ORDER — KETOROLAC TROMETHAMINE 30 MG/ML IJ SOLN
INTRAMUSCULAR | Status: AC
  Filled 2019-09-23: qty 1

## 2019-09-23 MED ORDER — OXYTOCIN 40 UNITS IN NORMAL SALINE INFUSION - SIMPLE MED: 2.5000 [IU]/h | INTRAVENOUS | Status: DC

## 2019-09-23 MED ORDER — ONDANSETRON HCL 4 MG/2ML IJ SOLN: 4.0000 mg | Freq: Four times a day (QID) | INTRAMUSCULAR | Status: DC | PRN

## 2019-09-23 MED ORDER — SOD CITRATE-CITRIC ACID 500-334 MG/5ML PO SOLN
30.0000 mL | ORAL | Status: DC | PRN
  Administered 2019-09-23: 30 mL via ORAL
  Filled 2019-09-23 (×2): qty 30

## 2019-09-23 MED ORDER — CARBOPROST TROMETHAMINE 250 MCG/ML IM SOLN
INTRAMUSCULAR | Status: AC
  Filled 2019-09-23: qty 1

## 2019-09-23 MED ORDER — SCOPOLAMINE 1 MG/3DAYS TD PT72
MEDICATED_PATCH | TRANSDERMAL | Status: AC
  Filled 2019-09-23: qty 1

## 2019-09-23 MED ORDER — ACETAMINOPHEN 325 MG PO TABS: 650.0000 mg | ORAL_TABLET | ORAL | Status: DC | PRN

## 2019-09-23 MED ORDER — LIDOCAINE-EPINEPHRINE (PF) 2 %-1:200000 IJ SOLN
INTRAMUSCULAR | Status: AC
  Filled 2019-09-23: qty 10

## 2019-09-23 MED ORDER — CARBOPROST TROMETHAMINE 250 MCG/ML IM SOLN
INTRAMUSCULAR | Status: DC | PRN
  Administered 2019-09-23: 250 ug via INTRAMUSCULAR

## 2019-09-23 MED ORDER — OXYTOCIN BOLUS FROM INFUSION: 500.0000 mL | Freq: Once | INTRAVENOUS | Status: DC

## 2019-09-23 MED ORDER — LIDOCAINE HCL (PF) 1 % IJ SOLN: 30.0000 mL | INTRAMUSCULAR | Status: DC | PRN

## 2019-09-23 MED ORDER — DIPHENOXYLATE-ATROPINE 2.5-0.025 MG PO TABS
2.0000 | ORAL_TABLET | Freq: Once | ORAL | Status: AC
  Administered 2019-09-23: 2 via ORAL

## 2019-09-23 MED ORDER — COCONUT OIL OIL
1.0000 "application " | TOPICAL_OIL | Status: DC | PRN
  Administered 2019-09-24: 1 via TOPICAL

## 2019-09-23 MED ORDER — METOCLOPRAMIDE HCL 5 MG/ML IJ SOLN
INTRAMUSCULAR | Status: AC
  Filled 2019-09-23: qty 2

## 2019-09-23 MED ORDER — OXYCODONE HCL 5 MG PO TABS
5.0000 mg | ORAL_TABLET | ORAL | Status: DC | PRN
  Administered 2019-09-24 – 2019-09-25 (×3): 5 mg via ORAL
  Filled 2019-09-23 (×3): qty 1

## 2019-09-23 MED ORDER — OXYCODONE HCL 5 MG/5ML PO SOLN: 5.0000 mg | Freq: Once | ORAL | Status: DC | PRN

## 2019-09-23 MED ORDER — OXYCODONE-ACETAMINOPHEN 5-325 MG PO TABS: 2.0000 | ORAL_TABLET | ORAL | Status: DC | PRN

## 2019-09-23 MED ORDER — METHYLERGONOVINE MALEATE 0.2 MG/ML IJ SOLN: INTRAMUSCULAR | Status: DC | PRN

## 2019-09-23 MED ORDER — EPHEDRINE 5 MG/ML INJ: 10.0000 mg | INTRAVENOUS | Status: DC | PRN

## 2019-09-23 MED ORDER — DIBUCAINE (PERIANAL) 1 % EX OINT: 1.0000 "application " | TOPICAL_OINTMENT | CUTANEOUS | Status: DC | PRN

## 2019-09-23 MED ORDER — NALBUPHINE HCL 10 MG/ML IJ SOLN: 5.0000 mg | Freq: Once | INTRAMUSCULAR | Status: DC | PRN

## 2019-09-23 MED ORDER — DEXAMETHASONE SODIUM PHOSPHATE 4 MG/ML IJ SOLN
INTRAMUSCULAR | Status: AC
  Filled 2019-09-23: qty 1

## 2019-09-23 MED ORDER — MENTHOL 3 MG MT LOZG: 1.0000 | LOZENGE | OROMUCOSAL | Status: DC | PRN

## 2019-09-23 MED ORDER — OXYTOCIN 40 UNITS IN NORMAL SALINE INFUSION - SIMPLE MED
1.0000 m[IU]/min | INTRAVENOUS | Status: DC
  Administered 2019-09-23: 2 m[IU]/min via INTRAVENOUS
  Filled 2019-09-23: qty 1000

## 2019-09-23 MED ORDER — LACTATED RINGERS IV SOLN: INTRAVENOUS | Status: DC | PRN

## 2019-09-23 MED ORDER — ACETAMINOPHEN 325 MG PO TABS
650.0000 mg | ORAL_TABLET | ORAL | Status: DC | PRN
  Administered 2019-09-24 – 2019-09-25 (×4): 650 mg via ORAL
  Filled 2019-09-23 (×4): qty 2

## 2019-09-23 MED ORDER — PHENYLEPHRINE 40 MCG/ML (10ML) SYRINGE FOR IV PUSH (FOR BLOOD PRESSURE SUPPORT): 80.0000 ug | PREFILLED_SYRINGE | INTRAVENOUS | Status: DC | PRN

## 2019-09-23 MED ORDER — WITCH HAZEL-GLYCERIN EX PADS: 1.0000 "application " | MEDICATED_PAD | CUTANEOUS | Status: DC | PRN

## 2019-09-23 MED ORDER — ONDANSETRON HCL 4 MG/2ML IJ SOLN
INTRAMUSCULAR | Status: DC | PRN
  Administered 2019-09-23: 4 mg via INTRAVENOUS

## 2019-09-23 MED ORDER — DEXAMETHASONE SODIUM PHOSPHATE 4 MG/ML IJ SOLN
INTRAMUSCULAR | Status: DC | PRN
  Administered 2019-09-23: 4 mg via INTRAVENOUS

## 2019-09-23 MED ORDER — SIMETHICONE 80 MG PO CHEW
80.0000 mg | CHEWABLE_TABLET | Freq: Three times a day (TID) | ORAL | Status: DC
  Administered 2019-09-24 (×3): 80 mg via ORAL
  Filled 2019-09-23 (×4): qty 1

## 2019-09-23 MED ORDER — OXYCODONE-ACETAMINOPHEN 5-325 MG PO TABS: 1.0000 | ORAL_TABLET | ORAL | Status: DC | PRN

## 2019-09-23 MED ORDER — KETOROLAC TROMETHAMINE 30 MG/ML IJ SOLN
30.0000 mg | Freq: Once | INTRAMUSCULAR | Status: AC | PRN
  Administered 2019-09-23: 30 mg via INTRAVENOUS

## 2019-09-23 MED ORDER — NALBUPHINE HCL 10 MG/ML IJ SOLN: 5.0000 mg | INTRAMUSCULAR | Status: DC | PRN

## 2019-09-23 MED ORDER — DIPHENHYDRAMINE HCL 50 MG/ML IJ SOLN: 12.5000 mg | INTRAMUSCULAR | Status: DC | PRN

## 2019-09-23 MED ORDER — METOCLOPRAMIDE HCL 5 MG/ML IJ SOLN
INTRAMUSCULAR | Status: DC | PRN
  Administered 2019-09-23: 10 mg via INTRAVENOUS

## 2019-09-23 MED ORDER — PHENYLEPHRINE 40 MCG/ML (10ML) SYRINGE FOR IV PUSH (FOR BLOOD PRESSURE SUPPORT)
PREFILLED_SYRINGE | INTRAVENOUS | Status: AC
  Filled 2019-09-23: qty 10

## 2019-09-23 MED ORDER — SCOPOLAMINE 1 MG/3DAYS TD PT72: 1.0000 | MEDICATED_PATCH | Freq: Once | TRANSDERMAL | Status: DC

## 2019-09-23 MED ORDER — SODIUM CHLORIDE 0.9 % IR SOLN
Status: DC | PRN
  Administered 2019-09-23: 1

## 2019-09-23 MED ORDER — FENTANYL CITRATE (PF) 100 MCG/2ML IJ SOLN
INTRAMUSCULAR | Status: DC | PRN
  Administered 2019-09-23: 100 ug via EPIDURAL

## 2019-09-23 MED ORDER — BUTORPHANOL TARTRATE 1 MG/ML IJ SOLN: 1.0000 mg | INTRAMUSCULAR | Status: DC | PRN

## 2019-09-23 MED ORDER — TRANEXAMIC ACID 1000 MG/10ML IV SOLN
INTRAVENOUS | Status: DC | PRN
  Administered 2019-09-23: 1000 mg via INTRAVENOUS

## 2019-09-23 MED ORDER — MORPHINE SULFATE (PF) 0.5 MG/ML IJ SOLN
INTRAMUSCULAR | Status: DC | PRN
  Administered 2019-09-23: 3 mg via EPIDURAL

## 2019-09-23 MED ORDER — NALOXONE HCL 4 MG/10ML IJ SOLN
1.0000 ug/kg/h | INTRAVENOUS | Status: DC | PRN
  Filled 2019-09-23: qty 5

## 2019-09-23 MED ORDER — PRENATAL MULTIVITAMIN CH
1.0000 | ORAL_TABLET | Freq: Every day | ORAL | Status: DC
  Administered 2019-09-25: 1 via ORAL
  Filled 2019-09-23 (×2): qty 1

## 2019-09-23 MED ORDER — OXYCODONE HCL 5 MG PO TABS: 5.0000 mg | ORAL_TABLET | Freq: Once | ORAL | Status: DC | PRN

## 2019-09-23 MED ORDER — SIMETHICONE 80 MG PO CHEW
80.0000 mg | CHEWABLE_TABLET | ORAL | Status: DC
  Administered 2019-09-23 – 2019-09-25 (×2): 80 mg via ORAL
  Filled 2019-09-23 (×2): qty 1

## 2019-09-23 MED ORDER — DIPHENHYDRAMINE HCL 25 MG PO CAPS: 25.0000 mg | ORAL_CAPSULE | Freq: Four times a day (QID) | ORAL | Status: DC | PRN

## 2019-09-23 MED ORDER — PHENYLEPHRINE HCL (PRESSORS) 10 MG/ML IV SOLN
INTRAVENOUS | Status: DC | PRN
  Administered 2019-09-23: 40 ug via INTRAVENOUS
  Administered 2019-09-23 (×3): 80 ug via INTRAVENOUS
  Administered 2019-09-23: 40 ug via INTRAVENOUS

## 2019-09-23 MED ORDER — TERBUTALINE SULFATE 1 MG/ML IJ SOLN: 0.2500 mg | Freq: Once | INTRAMUSCULAR | Status: DC | PRN

## 2019-09-23 MED ORDER — HYDROMORPHONE HCL 1 MG/ML IJ SOLN: 0.2500 mg | INTRAMUSCULAR | Status: DC | PRN

## 2019-09-23 MED ORDER — LACTATED RINGERS IV SOLN: 500.0000 mL | INTRAVENOUS | Status: DC | PRN

## 2019-09-23 MED ORDER — FENTANYL-BUPIVACAINE-NACL 0.5-0.125-0.9 MG/250ML-% EP SOLN
12.0000 mL/h | EPIDURAL | Status: DC | PRN
  Filled 2019-09-23: qty 250

## 2019-09-23 MED ORDER — LACTATED RINGERS IV SOLN
500.0000 mL | Freq: Once | INTRAVENOUS | Status: AC
  Administered 2019-09-23: 500 mL via INTRAVENOUS

## 2019-09-23 MED ORDER — TRANEXAMIC ACID-NACL 1000-0.7 MG/100ML-% IV SOLN
INTRAVENOUS | Status: AC
  Filled 2019-09-23: qty 100

## 2019-09-23 MED ORDER — SENNOSIDES-DOCUSATE SODIUM 8.6-50 MG PO TABS
2.0000 | ORAL_TABLET | ORAL | Status: DC
  Administered 2019-09-23 – 2019-09-25 (×2): 2 via ORAL
  Filled 2019-09-23 (×2): qty 2

## 2019-09-23 MED ORDER — LIDOCAINE HCL (PF) 1 % IJ SOLN
INTRAMUSCULAR | Status: DC | PRN
  Administered 2019-09-23: 5 mL via EPIDURAL

## 2019-09-23 MED ORDER — OXYTOCIN 10 UNIT/ML IJ SOLN
INTRAMUSCULAR | Status: DC | PRN
  Administered 2019-09-23: 40 [IU] via INTRAMUSCULAR

## 2019-09-23 MED ORDER — OXYTOCIN 40 UNITS IN NORMAL SALINE INFUSION - SIMPLE MED
INTRAVENOUS | Status: AC
  Filled 2019-09-23: qty 1000

## 2019-09-23 MED ORDER — TRANEXAMIC ACID 1000 MG/10ML IV SOLN: INTRAVENOUS | Status: DC | PRN

## 2019-09-23 MED ORDER — DIPHENHYDRAMINE HCL 25 MG PO CAPS: 25.0000 mg | ORAL_CAPSULE | ORAL | Status: DC | PRN

## 2019-09-23 MED ORDER — LIDOCAINE-EPINEPHRINE (PF) 2 %-1:200000 IJ SOLN
INTRAMUSCULAR | Status: DC | PRN
  Administered 2019-09-23: 5 mL via EPIDURAL

## 2019-09-23 MED ORDER — MORPHINE SULFATE (PF) 0.5 MG/ML IJ SOLN
INTRAMUSCULAR | Status: AC
  Filled 2019-09-23: qty 10

## 2019-09-23 MED ORDER — NALOXONE HCL 0.4 MG/ML IJ SOLN: 0.4000 mg | INTRAMUSCULAR | Status: DC | PRN

## 2019-09-23 MED ORDER — SODIUM CHLORIDE 0.9 % IV SOLN: INTRAVENOUS | Status: DC | PRN

## 2019-09-23 MED ORDER — PENICILLIN G POT IN DEXTROSE 60000 UNIT/ML IV SOLN
3.0000 10*6.[IU] | INTRAVENOUS | Status: DC
  Administered 2019-09-23 (×2): 3 10*6.[IU] via INTRAVENOUS
  Filled 2019-09-23 (×2): qty 50

## 2019-09-23 MED ORDER — OXYTOCIN 40 UNITS IN NORMAL SALINE INFUSION - SIMPLE MED: 2.5000 [IU]/h | INTRAVENOUS | Status: AC

## 2019-09-23 MED ORDER — TETANUS-DIPHTH-ACELL PERTUSSIS 5-2.5-18.5 LF-MCG/0.5 IM SUSP: 0.5000 mL | Freq: Once | INTRAMUSCULAR | Status: DC

## 2019-09-23 MED ORDER — SODIUM CHLORIDE 0.9 % IV SOLN
5.0000 10*6.[IU] | Freq: Once | INTRAVENOUS | Status: AC
  Administered 2019-09-23: 5 10*6.[IU] via INTRAVENOUS
  Filled 2019-09-23: qty 5

## 2019-09-23 MED ORDER — CEFAZOLIN SODIUM-DEXTROSE 2-3 GM-%(50ML) IV SOLR
INTRAVENOUS | Status: DC | PRN
  Administered 2019-09-23: 2 g via INTRAVENOUS

## 2019-09-23 MED ORDER — SODIUM CHLORIDE (PF) 0.9 % IJ SOLN
INTRAMUSCULAR | Status: DC | PRN
  Administered 2019-09-23: 12 mL/h via EPIDURAL

## 2019-09-23 MED ORDER — DIPHENOXYLATE-ATROPINE 2.5-0.025 MG PO TABS
ORAL_TABLET | ORAL | Status: AC
  Filled 2019-09-23: qty 2

## 2019-09-23 MED ORDER — ONDANSETRON HCL 4 MG/2ML IJ SOLN
INTRAMUSCULAR | Status: AC
  Filled 2019-09-23: qty 2

## 2019-09-23 MED ORDER — ONDANSETRON HCL 4 MG/2ML IJ SOLN: 4.0000 mg | Freq: Once | INTRAMUSCULAR | Status: DC | PRN

## 2019-09-23 MED ORDER — ONDANSETRON HCL 4 MG/2ML IJ SOLN: 4.0000 mg | Freq: Three times a day (TID) | INTRAMUSCULAR | Status: DC | PRN

## 2019-09-23 MED ORDER — SODIUM CHLORIDE 0.9% FLUSH: 3.0000 mL | INTRAVENOUS | Status: DC | PRN

## 2019-09-23 SURGICAL SUPPLY — 37 items
BENZOIN TINCTURE PRP APPL 2/3 (GAUZE/BANDAGES/DRESSINGS) ×3 IMPLANT
CHLORAPREP W/TINT 26ML (MISCELLANEOUS) ×3 IMPLANT
CLAMP CORD UMBIL (MISCELLANEOUS) IMPLANT
CLOSURE WOUND 1/2 X4 (GAUZE/BANDAGES/DRESSINGS) ×1
CLOTH BEACON ORANGE TIMEOUT ST (SAFETY) ×3 IMPLANT
DRSG OPSITE POSTOP 4X10 (GAUZE/BANDAGES/DRESSINGS) ×3 IMPLANT
ELECT REM PT RETURN 9FT ADLT (ELECTROSURGICAL) ×3
ELECTRODE REM PT RTRN 9FT ADLT (ELECTROSURGICAL) ×1 IMPLANT
EXTRACTOR VACUUM KIWI (MISCELLANEOUS) IMPLANT
GLOVE BIO SURGEON STRL SZ 6.5 (GLOVE) ×2 IMPLANT
GLOVE BIO SURGEONS STRL SZ 6.5 (GLOVE) ×1
GLOVE BIOGEL PI IND STRL 7.0 (GLOVE) ×1 IMPLANT
GLOVE BIOGEL PI INDICATOR 7.0 (GLOVE) ×2
GOWN STRL REUS W/TWL LRG LVL3 (GOWN DISPOSABLE) ×6 IMPLANT
KIT ABG SYR 3ML LUER SLIP (SYRINGE) IMPLANT
NEEDLE HYPO 25X5/8 SAFETYGLIDE (NEEDLE) IMPLANT
NS IRRIG 1000ML POUR BTL (IV SOLUTION) ×3 IMPLANT
PACK C SECTION WH (CUSTOM PROCEDURE TRAY) ×3 IMPLANT
PAD OB MATERNITY 4.3X12.25 (PERSONAL CARE ITEMS) ×3 IMPLANT
PENCIL SMOKE EVAC W/HOLSTER (ELECTROSURGICAL) ×3 IMPLANT
RTRCTR C-SECT PINK 25CM LRG (MISCELLANEOUS) ×3 IMPLANT
STRIP CLOSURE SKIN 1/2X4 (GAUZE/BANDAGES/DRESSINGS) ×2 IMPLANT
SUT CHROMIC 1 CTX 36 (SUTURE) ×9 IMPLANT
SUT PLAIN 0 NONE (SUTURE) IMPLANT
SUT PLAIN 2 0 XLH (SUTURE) ×3 IMPLANT
SUT VIC AB 0 CT1 27 (SUTURE) ×6
SUT VIC AB 0 CT1 27XBRD ANBCTR (SUTURE) ×2 IMPLANT
SUT VIC AB 2-0 CT1 27 (SUTURE) ×3
SUT VIC AB 2-0 CT1 TAPERPNT 27 (SUTURE) ×1 IMPLANT
SUT VIC AB 3-0 CT1 27 (SUTURE)
SUT VIC AB 3-0 CT1 TAPERPNT 27 (SUTURE) IMPLANT
SUT VIC AB 3-0 SH 27 (SUTURE) ×3
SUT VIC AB 3-0 SH 27X BRD (SUTURE) ×1 IMPLANT
SUT VIC AB 4-0 KS 27 (SUTURE) ×3 IMPLANT
TOWEL OR 17X24 6PK STRL BLUE (TOWEL DISPOSABLE) ×3 IMPLANT
TRAY FOLEY W/BAG SLVR 14FR LF (SET/KITS/TRAYS/PACK) ×3 IMPLANT
WATER STERILE IRR 1000ML POUR (IV SOLUTION) ×3 IMPLANT

## 2019-09-23 NOTE — Anesthesia Preprocedure Evaluation (Addendum)
Anesthesia Evaluation  Patient identified by MRN, date of birth, ID band Patient awake    Reviewed: Allergy & Precautions, NPO status , Patient's Chart, lab work & pertinent test results  Airway Mallampati: II  TM Distance: >3 FB Neck ROM: Full    Dental no notable dental hx. (+) Teeth Intact, Dental Advisory Given   Pulmonary neg pulmonary ROS,    Pulmonary exam normal breath sounds clear to auscultation       Cardiovascular negative cardio ROS Normal cardiovascular exam Rhythm:Regular Rate:Normal     Neuro/Psych negative neurological ROS  negative psych ROS   GI/Hepatic negative GI ROS, Neg liver ROS,   Endo/Other  diabetes, Gestational  Renal/GU negative Renal ROS     Musculoskeletal negative musculoskeletal ROS (+)   Abdominal   Peds  Hematology Hgb 13.4 Plt 169   Anesthesia Other Findings   Reproductive/Obstetrics (+) Pregnancy                            Anesthesia Physical Anesthesia Plan  ASA: III  Anesthesia Plan: Epidural   Post-op Pain Management:    Induction:   PONV Risk Score and Plan:   Airway Management Planned:   Additional Equipment:   Intra-op Plan:   Post-operative Plan:   Informed Consent: I have reviewed the patients History and Physical, chart, labs and discussed the procedure including the risks, benefits and alternatives for the proposed anesthesia with the patient or authorized representative who has indicated his/her understanding and acceptance.       Plan Discussed with:   Anesthesia Plan Comments: (39 4/7 wk G4P1 w hx of GDm and IVF for LEA)        Anesthesia Quick Evaluation

## 2019-09-23 NOTE — Progress Notes (Signed)
Hypoglycemic Event  CBG: 67  Treatment: 4 oz apple juice  Symptoms: none  Follow-up CBG: Time: [id number] CBG Result: 94   Patrik Turnbaugh P Lesia Sago

## 2019-09-23 NOTE — Anesthesia Procedure Notes (Signed)
Epidural Patient location during procedure: OB Start time: 09/23/2019 10:08 AM End time: 09/23/2019 10:22 AM  Staffing Anesthesiologist: Barnet Glasgow, MD Performed: anesthesiologist   Preanesthetic Checklist Completed: patient identified, IV checked, site marked, risks and benefits discussed, surgical consent, monitors and equipment checked, pre-op evaluation and timeout performed  Epidural Patient position: sitting Prep: DuraPrep and site prepped and draped Patient monitoring: continuous pulse ox and blood pressure Approach: midline Injection technique: LOR air  Needle:  Needle type: Tuohy  Needle gauge: 17 G Needle length: 9 cm and 9 Needle insertion depth: 6 cm Catheter type: closed end flexible Catheter size: 19 Gauge Catheter at skin depth: 11 cm Test dose: negative  Assessment Events: blood not aspirated, injection not painful, no injection resistance, no paresthesia and negative IV test  Additional Notes Patient identified. Risks/Benefits/Options discussed with patient including but not limited to bleeding, infection, nerve damage, paralysis, failed block, incomplete pain control, headache, blood pressure changes, nausea, vomiting, reactions to medication both or allergic, itching and postpartum back pain. Confirmed with bedside nurse the patient's most recent platelet count. Confirmed with patient that they are not currently taking any anticoagulation, have any bleeding history or any family history of bleeding disorders. Patient expressed understanding and wished to proceed. All questions were answered. Sterile technique was used throughout the entire procedure. Please see nursing notes for vital signs. Test dose was given through epidural needle and negative prior to continuing to dose epidural or start infusion. Warning signs of high block given to the patient including shortness of breath, tingling/numbness in hands, complete motor block, or any concerning symptoms  with instructions to call for help. Patient was given instructions on fall risk and not to get out of bed. All questions and concerns addressed with instructions to call with any issues. 1 Attempt (S) . Patient tolerated procedure well.

## 2019-09-23 NOTE — Op Note (Signed)
Operative Note    Preoperative Diagnosis Term pregnancy at 53 3/[redacted] weeks Gestational diabetes  Arrest of descent Category 2 tracing  Postoperative Diagnosis Same with adherent placenta and uterine atony  Procedure Primary low transverse c-section with two layer closure if uterus   Surgeon Paula Compton, MD  Anesthesia Epidural  Fluids: EBL 1538mL UOP 45mL blood tinged (was prior to OR) and now clearing IVF 2811mL  Findings A viable female infant in the vertex presentation Apgars 9,10 Weight pending  Uterus was atonic and required hemobate, pitocin, and TXA.  The placenta was slightly adherent in the top right fundus and fragments removed with manual cavity exploration. Sent to pathology.  Tubes and ovaries WNL.  Specimen Placenta to L&D  Procedure Note Patient was taken to the operating room where epidural anesthesia was boosted and found to be adequate by Allis clamp test. She was prepped and draped in the normal sterile fashion in the dorsal supine position with a leftward tilt. An appropriate time out was performed. A Pfannenstiel skin incision was then made with the scalpel and carried through to the underlying layer of fascia by sharp dissection and Bovie cautery. The fascia was nicked in the midline and the incision was extended laterally with Mayo scissors. The inferior aspect of the incision was grasped Coker clamps and dissected off the underlying rectus muscles. In a similar fashion the superior aspect was dissected off the rectus muscles. Rectus muscles were separated in the midline and the peritoneal cavity entered bluntly. The peritoneal incision was then extended both superiorly and inferiorly with careful attention to avoid both bowel and bladder. The Alexis self-retaining wound retractor was then placed within the incision and the lower uterine segment exposed. The bladder flap was developed with Metzenbaum scissors and pushed away from the lower uterine segment. The  lower uterine segment was then incised in a transverse fashion and the cavity itself entered bluntly. The incision was extended bluntly. The infant's head was then lifted from the pelvis and delivered from the incision without difficulty. The remainder of the infant delivered and the nose and mouth bulb suctioned with the cord clamped and cut as well. The infant was handed off to the waiting pediatricians after delayed cord clamping. The placenta was attempted to be spontaneously expressed from the uterus, but was felt to be adherent in the upper right fundus.  Care was taken to develop a plane, but at that area there were adherent fragments that remained after the placenta delivered.  Manual exploration was performed and two pieces of placenta removed about 2-3 cm each.  There was no other placental tissue felt and the uterus cleared of all clots and debris with moist lap sponge.   Because of her brisk bleeding from atony,  she was given hemobate and TXA  and the uterus exteriorized to perform massage.   The uterine incision was then repaired in 2 layers the first layer was a running locked layer 1-0 chromic and the second an imbricating layer of the same suture. An additional figure of eight suture was placed for hemostasis at the midline.   The tubes and ovaries were inspected and the gutters cleared of all clots and debris. The uterine incision was inspected and found to be hemostatic. All instruments and sponges as well as the Alexis retractor were then removed from the abdomen. The rectus muscles and peritoneum were then reapproximated with a running suture of 2-0 Vicryl. The fascia was then closed with 0 Vicryl in a running  fashion. Subcutaneous tissue was reapproximated with 3-0 plain in a running fashion. The skin was closed with a subcuticular stitch of 4-0 Vicryl on a Keith needle and then reinforced with benzoin and Steri-Strips. At the conclusion of the procedure all instruments and sponge counts were  correct. Patient was taken to the recovery room in good condition with her baby accompanying her skin to skin.  D/w pt and they desire circumcision in the hospital.

## 2019-09-23 NOTE — Progress Notes (Signed)
Patient ID: Jasmine Bell, female   DOB: [birth year], 34 y.o.   MRN: [id number] Feeling some mild/mod contractions on pitocin  afeb VSS FHR category 1  Fundus NT  Cervix 50/2/-2 AROM with FSE as cervix was still posterior  PCN on board for +GBS Pitocin per protocol until adequate contractions Epidural PRN

## 2019-09-23 NOTE — Transfer of Care (Signed)
Immediate Anesthesia Transfer of Care Note  Patient: Jasmine Bell  Procedure(s) Performed: CESAREAN SECTION (N/A )  Patient Location: PACU  Anesthesia Type:Epidural  Level of Consciousness: awake  Airway & Oxygen Therapy: Patient Spontanous Breathing  Post-op Assessment: Report given to RN  Post vital signs: Reviewed and stable  Last Vitals:  Vitals Value Taken Time  BP    Temp    Pulse 82 09/23/19 1909  Resp 17 09/23/19 1909  SpO2 96 % 09/23/19 1909  Vitals shown include unvalidated device data.  Last Pain:  Vitals:   09/23/19 1615  TempSrc: Axillary  PainSc:          Complications: No apparent anesthesia complications

## 2019-09-23 NOTE — Progress Notes (Signed)
Patient ID: Jasmine Bell, female   DOB: [birth year], 34 y.o.   MRN: [id number] Pt reached complete dilation at 430pm and began pushing with good effort.  We pushed for 45 minutes with no sustained descent of vertex despite good maternal effort.  FHR has had some variables with pushing and now baseline increased and losing variability-- Category 2.    I feel this baby is substantially larger than her first.  We discussed options of continuing to push vs c-section.  I do have concern of shoulder dystocia if she required operative assisted vaginal delivery for FHR even if able to push the baby to a full +2 station, and I shared that with her.  The patient desires to proceed with c-section  We discussed risk of bleeding, infection and possible damage to bowel and bladder.  Pitocin turned off.  The OR has been notified and we will proceed when ready.

## 2019-09-23 NOTE — Progress Notes (Signed)
Patient ID: Jasmine Bell, female   DOB: [birth year], 34 y.o.   MRN: [id number] Received epidural and is comfortable  afeb VSS FHR category 1  Cervix 70/3-4/-1 Slight meconium stain with fluid  IUPC placed to adjust pitocin Will follow progress

## 2019-09-24 LAB — TYPE AND SCREEN
ABO/RH(D): O NEG
Antibody Screen: POSITIVE

## 2019-09-24 LAB — CBC
HCT: 25.6 % — ABNORMAL LOW (ref 36.0–46.0)
Hemoglobin: 8.8 g/dL — ABNORMAL LOW (ref 12.0–15.0)
MCH: 32.7 pg (ref 26.0–34.0)
MCHC: 34.4 g/dL (ref 30.0–36.0)
MCV: 95.2 fL (ref 80.0–100.0)
Platelets: 150 10*3/uL (ref 150–400)
RBC: 2.69 MIL/uL — ABNORMAL LOW (ref 3.87–5.11)
RDW: 13.2 % (ref 11.5–15.5)
WBC: 13.8 10*3/uL — ABNORMAL HIGH (ref 4.0–10.5)
nRBC: 0 % (ref 0.0–0.2)

## 2019-09-24 MED ORDER — RHO D IMMUNE GLOBULIN 1500 UNIT/2ML IJ SOSY
300.0000 ug | PREFILLED_SYRINGE | Freq: Once | INTRAMUSCULAR | Status: AC
  Administered 2019-09-24: 300 ug via INTRAVENOUS
  Filled 2019-09-24: qty 2

## 2019-09-24 NOTE — Progress Notes (Signed)
Rh IG workup ordered after morning CBC drawn. Zack from [facility] notified and order placed. MRN given and blood to be retrieved in order to run labwork and administer RhoGAM at appointed time during the day.  Rocky Crafts, RN

## 2019-09-24 NOTE — Progress Notes (Signed)
POSTPARTUM POSTOP PROGRESS NOTE  POD #1  Subjective:  No acute events overnight.  Pt denies problems with ambulating or po intake.  She denies nausea or vomiting.  Pain is well controlled.  She has not had flatus. She has not had bowel movement.  Lochia Minimal. Notes some "swimmy head" when she first stood up but now feels improved. Foley still in place. Reviewed placental findings and watching bleeding in postop period  Objective: Blood pressure 95/67, pulse 78, temperature 97.8 F (36.6 C), resp. rate 18, height 5\' 2"  (1.575 m), weight 68.9 kg, SpO2 100 %, unknown if currently breastfeeding.  Physical Exam:  General: alert, cooperative and no distress Lochia:normal flow Chest: CTAB Heart: RRR no m/r/g Abdomen: +BS, soft, nontender Uterine Fundus: firm, 1cm below umbilicus. Appropriately tender for POD#1. Honeycomb dressing intact, negdrainage Extremities: neg edema, neg calf TTP BL, neg Homans BL  Recent Labs    09/23/19 0719 09/24/19 0536  HGB 13.4 8.8*  HCT 39.4 25.6*    Assessment/Plan:  ASSESSMENT: Jasmine Bell is a 34 y.o. [id number] s/p PLTCS @ [redacted]w[redacted]d for arrest of descent. PNC c/b GDM, Rh neg IVF pregnancy.  Anemia -2/2 acute blood loss intraop. Asx this AM, however will begin Fe/colace regimen today  GDM - will need 2hr GTT postpartum  Baby boy will need circ, mom would like for POD#2   Plan for discharge tomorrow, Breastfeeding, Lactation consult and Circumcision prior to discharge   LOS: 1 day

## 2019-09-24 NOTE — Lactation Note (Signed)
This note was copied from a baby's chart. Lactation Consultation Note  Patient Name: Jasmine Bell [id number] Date: 09/24/2019   P2, Baby 53 hours old and recently breastfed for 30 min.  Mother states she has hand expressed drops prior to latching.   Mother worried about her supply with her first child and bf pumped/supplemented and bottle fed for 8 mos. She is happy this baby is latching so much better than her first and seems more content after feedings. Discussed cluster feeding. Feed on demand with cues.  Goal 8-12+ times per day after first 24 hrs.  Place baby STS if not cueing.  Mom made aware of O/P services, breastfeeding support groups, community resources, and our phone # for post-discharge questions.       Maternal Data    Feeding Feeding Type: Breast Fed  LATCH Score                   Interventions    Lactation Tools Discussed/Used     Consult Status      Carlye Grippe 09/24/2019, 10:49 AM

## 2019-09-24 NOTE — Anesthesia Postprocedure Evaluation (Signed)
Anesthesia Post Note  Patient: Jasmine Bell  Procedure(s) Performed: CESAREAN SECTION (N/A )     Patient location during evaluation: PACU Anesthesia Type: Epidural and MAC Level of consciousness: awake and alert Pain management: pain level controlled Vital Signs Assessment: post-procedure vital signs reviewed and stable Respiratory status: spontaneous breathing, nonlabored ventilation, respiratory function stable and patient connected to nasal cannula oxygen Cardiovascular status: stable and blood pressure returned to baseline Postop Assessment: no apparent nausea or vomiting and epidural receding Anesthetic complications: no    Last Vitals:  Vitals:   09/24/19 1213 09/24/19 1451  BP: (!) 90/38 128/87  Pulse: 80 87  Resp: 18 18  Temp: 36.6 C 37.1 C  SpO2: 99%     Last Pain:  Vitals:   09/24/19 1530  TempSrc:   PainSc: 3    Pain Goal:                   Toni Demo

## 2019-09-25 LAB — RH IG WORKUP (INCLUDES ABO/RH)
ABO/RH(D): O NEG
Fetal Screen: NEGATIVE
Gestational Age(Wks): 39.3
Unit division: 0

## 2019-09-25 LAB — SURGICAL PATHOLOGY

## 2019-09-25 MED ORDER — OXYCODONE HCL 5 MG PO TABS: 5.0000 mg | ORAL_TABLET | ORAL | 0 refills | Status: DC | PRN

## 2019-09-25 MED ORDER — IBUPROFEN 800 MG PO TABS: 800.0000 mg | ORAL_TABLET | Freq: Three times a day (TID) | ORAL | 1 refills | Status: DC

## 2019-09-25 MED ORDER — PRENATAL MULTIVITAMIN CH: 1.0000 | ORAL_TABLET | Freq: Every day | ORAL | 3 refills | Status: DC

## 2019-09-25 NOTE — Discharge Summary (Signed)
OB Discharge Summary     Patient Name: Jasmine Bell DOB: 12/22/1985 MRN: [id number]  Date of admission: 09/23/2019 Delivering MD: Paula Compton   Date of discharge: 09/25/2019  Admitting diagnosis: Indication for care in labor and delivery, antepartum [O75.9] Status post primary low transverse cesarean section [Z98.891] Intrauterine pregnancy: [redacted]w[redacted]d     Secondary diagnosis:  Active Problems:   Indication for care in labor and delivery, antepartum   Status post primary low transverse cesarean section  Additional problems: arrest of descent     Discharge diagnosis: Term Pregnancy Delivered                                                                                                Post partum procedures:rhogam  Augmentation: AROM and Pitocin  Complications: None  Hospital course:  Induction of Labor With Cesarean Section  34 y.o. yo [id number] at [redacted]w[redacted]d was admitted to the hospital 09/23/2019 for induction of labor. Patient had a labor course significant for arrest of descent. The patient went for cesarean section due to Arrest of Descent, and delivered a Viable infant,09/23/2019  Membrane Rupture Time/Date: 9:48 AM ,09/23/2019   Details of operation can be found in separate operative Note.  Patient had an uncomplicated postpartum course. She is ambulating, tolerating a regular diet, passing flatus, and urinating well.  Patient is discharged home in stable condition on 09/25/19.                                    Physical exam  Vitals:   09/24/19 1213 09/24/19 1451 09/24/19 2240 09/25/19 0534  BP: (!) 90/38 128/87 (!) 99/58 97/61  Pulse: 80 87 98 82  Resp: 18 18 18 18   Temp: 97.9 F (36.6 C) 98.7 F (37.1 C) 98.1 F (36.7 C) 98 F (36.7 C)  TempSrc: Oral Oral Oral Oral  SpO2: 99%   100%  Weight:      Height:       General: alert and no distress Lochia: appropriate Uterine Fundus: firm Incision: Healing well with no significant drainage DVT Evaluation: No  evidence of DVT seen on physical exam. Labs: Lab Results  Component Value Date   WBC 13.8 (H) 09/24/2019   HGB 8.8 (L) 09/24/2019   HCT 25.6 (L) 09/24/2019   MCV 95.2 09/24/2019   PLT 150 09/24/2019   CMP Latest Ref Rng & Units 01/12/2018  Glucose 65 - 99 mg/dL 73  BUN 6 - 20 mg/dL 9  Creatinine 0.57 - 1.00 mg/dL 0.58  Sodium 134 - 144 mmol/L 141  Potassium 3.5 - 5.2 mmol/L 4.4  Chloride 96 - 106 mmol/L 102  CO2 20 - 29 mmol/L 24  Calcium 8.7 - 10.2 mg/dL 9.7  Total Protein 6.0 - 8.5 g/dL 6.9  Total Bilirubin 0.0 - 1.2 mg/dL 1.1  Alkaline Phos 39 - 117 IU/L 54  AST 0 - 40 IU/L 15  ALT 0 - 32 IU/L 16    Discharge instruction: per After Visit Summary and "Baby and Me Booklet".  After visit meds:  Allergies as of 09/25/2019   No Known Allergies     Medication List    TAKE these medications   ibuprofen 800 MG tablet Commonly known as: ADVIL Take 1 tablet (800 mg total) by mouth every 8 (eight) hours.   oxyCODONE 5 MG immediate release tablet Commonly known as: Oxy IR/ROXICODONE Take 1-2 tablets (5-10 mg total) by mouth every 4 (four) hours as needed for moderate pain.   prenatal multivitamin Tabs tablet Take 1 tablet by mouth daily at 12 noon.       Diet: routine diet  Activity: Advance as tolerated. Pelvic rest for 6 weeks.   Outpatient follow up:2 and 6 weeks Follow up Appt: Future Appointments  Date Time Provider [place]  09/27/2019 10:45 AM [place] [facility]   Follow up Visit:No follow-ups on file.  Postpartum contraception: Undecided  Newborn Data: Live born female  Birth Weight: 8 lb 10 oz (3912 g) APGAR: 3, 10  Newborn Delivery   Birth date/time: 09/23/2019 18:00:00 Delivery type: C-Section, Low Transverse Trial of labor: No C-section categorization: Primary      Baby Feeding: Breast Disposition:home with mother   09/25/2019 Janyth Contes, MD

## 2019-09-25 NOTE — Plan of Care (Signed)
Good for dc

## 2019-09-25 NOTE — Lactation Note (Signed)
This note was copied from a baby's chart. Lactation Consultation Note  Patient Name: Jasmine Bell [id number] Date: 09/25/2019 Reason for consult: Follow-up assessment  P2 mother whose infant is now 74 hours old.  This is a term baby with an 8% weight loss.  Mother breast fed, pumped and bottle fed her first child (now 57 1/35 years old) for 8 months.  Baby has an 8% weight loss.  Mother is planning to continue breast feeding and supplementing after discharge.  Mother has started supplementing and baby last fed 25 mls at 0620.  He was not showing feeding cues but beginning to awaken.  Mother interested in having me observe a feeding.    Suggested she feed him STS and mother removed his sleeper.  He began to awaken further.  Reviewed breast feeding basics and latching with mother.  Her breasts are soft and non tender and nipples are everted and intact.  Observed her beginning to prepare and made suggestions as to propping pillows appropriately and positioning baby more comfortably.  Assisted baby to latch deeply into the breast tissue and continued to educate and provide helpful suggestions.  Mother has been feeding each breast for approximately 30 minutes but I would have to questions whether the feedings have been effective.  I believe, after speaking with mother, that baby has not been latching effectively.  She commented on how this latch feels better and different.  I believe this to be a positive finding.  Even though an 8% weight loss is still "within normal limits" I suggested that he may have been expending too much energy feeding effectively.  Demonstrated breast compressions and gentle stimulation.  Baby began to awaken and feed better.  When he became restless I suggested mother burp him.  He burped well and latched again much easier for her.  After approximately 15 mintues he pulled off and fell asleep.  Showed parents that this was an effective feed and praised mother.  She held him closely  and he continued to sleep.  Engorgement prevention/treatment discussed.  Mother has a manual pump and a DEBP for home use.  She has our OP phone number for questions after discharge.  Explained how the parents can return for an OP visit with Ivin Booty and suggested they call their insurance company to determine eligibility.  Mother verbalized understanding.  Father very supportive.  Both parents receptive to learning and a pleasure to work with.     Maternal Data Formula Feeding for Exclusion: No Has patient been taught Hand Expression?: Yes Does the patient have breastfeeding experience prior to this delivery?: Yes  Feeding Feeding Type: Breast Fed Nipple Type: Slow - flow  LATCH Score Latch: Grasps breast easily, tongue down, lips flanged, rhythmical sucking.  Audible Swallowing: Spontaneous and intermittent  Type of Nipple: Everted at rest and after stimulation  Comfort (Breast/Nipple): Soft / non-tender  Hold (Positioning): Assistance needed to correctly position infant at breast and maintain latch.  LATCH Score: 9  Interventions Interventions: Breast feeding basics reviewed;Assisted with latch;Skin to skin;Breast massage;Breast compression;Adjust position;Hand pump;Coconut oil;Position options;Support pillows  Lactation Tools Discussed/Used     Consult Status Consult Status: Complete Date: 09/25/19 Follow-up type: Call as needed    Lanice Schwab Kierah Goatley 09/25/2019, 8:39 AM

## 2019-09-25 NOTE — Progress Notes (Signed)
Subjective: Postpartum Day 2: Cesarean Delivery Patient reports incisional pain, tolerating PO and no problems voiding.  Nl lochia, pain controlled.  Tolerating anemia well  Objective: Vital signs in last 24 hours: Temp:  [97.8 F (36.6 C)-98.7 F (37.1 C)] 98 F (36.7 C) (03/24 0534) Pulse Rate:  [78-98] 82 (03/24 0534) Resp:  [18] 18 (03/24 0534) BP: (90-128)/(38-87) 97/61 (03/24 0534) SpO2:  [99 %-100 %] 100 % (03/24 0534)  Physical Exam:  General: alert and no distress Lochia: appropriate Uterine Fundus: firm Incision: healing well DVT Evaluation: No evidence of DVT seen on physical exam.  Recent Labs    09/23/19 0719 09/24/19 0536  HGB 13.4 8.8*  HCT 39.4 25.6*    Assessment/Plan: Status post Cesarean section. Doing well postoperatively.  Continue current care. Baby circ today (will d/c after with motrin, percocet and PNV - f/u 2 and 6 weeks)  Greg Cratty Bovard-Stuckert 09/25/2019, 8:34 AM

## 2019-09-25 NOTE — Plan of Care (Signed)
Good for DC.

## 2019-09-27 ENCOUNTER — Ambulatory Visit: Payer: 59 | Attending: Internal Medicine

## 2019-09-27 ENCOUNTER — Encounter: Payer: Self-pay | Admitting: *Deleted

## 2019-09-27 DIAGNOSIS — Z23 Encounter for immunization: Secondary | ICD-10-CM

## 2019-09-27 NOTE — Progress Notes (Signed)
   Covid-19 Vaccination Clinic  Name:  Jasmine Bell    MRN: [id number] DOB: 03-02-1986  09/27/2019  [name] was observed post Covid-19 immunization for 15 minutes without incident. She was provided with Vaccine Information Sheet and instruction to access the V-Safe system.   [name] was instructed to call 911 with any severe reactions post vaccine: [name] Difficulty breathing  . Swelling of face and throat  . A fast heartbeat  . A bad rash all over body  . Dizziness and weakness   Immunizations Administered    Name Date Dose VIS Date Route   Pfizer COVID-19 Vaccine 09/27/2019 10:43 AM 0.3 mL 06/14/2019 Intramuscular   Manufacturer: [place]   Lot: [id number]   [place]: [id number]

## 2019-10-18 ENCOUNTER — Ambulatory Visit: Payer: 59 | Attending: Internal Medicine

## 2019-10-18 DIAGNOSIS — Z23 Encounter for immunization: Secondary | ICD-10-CM

## 2019-10-18 NOTE — Progress Notes (Signed)
   Covid-19 Vaccination Clinic  Name:  Jasmine Bell    MRN: [id number] DOB: 1986/02/24  10/18/2019  Jasmine Bell was observed post Covid-19 immunization for 15 minutes without incident. She was provided with Vaccine Information Sheet and instruction to access the V-Safe system.   Jasmine Bell was instructed to call 911 with any severe reactions post vaccine: [name] Difficulty breathing  . Swelling of face and throat  . A fast heartbeat  . A bad rash all over body  . Dizziness and weakness   Immunizations Administered    Name Date Dose VIS Date Route   Pfizer COVID-19 Vaccine 10/18/2019 10:30 AM 0.3 mL 06/14/2019 Intramuscular   Manufacturer: [place]   Lot: [id number]   [place]: [id number]

## 2019-10-21 ENCOUNTER — Ambulatory Visit: Payer: 59

## 2019-11-28 ENCOUNTER — Ambulatory Visit: Payer: 59 | Admitting: Internal Medicine

## 2019-12-04 ENCOUNTER — Other Ambulatory Visit: Payer: Self-pay

## 2019-12-06 ENCOUNTER — Encounter: Payer: Self-pay | Admitting: Internal Medicine

## 2019-12-06 ENCOUNTER — Other Ambulatory Visit: Payer: Self-pay

## 2019-12-06 ENCOUNTER — Ambulatory Visit (INDEPENDENT_AMBULATORY_CARE_PROVIDER_SITE_OTHER): Payer: 59 | Admitting: Internal Medicine

## 2019-12-06 VITALS — BP 110/70 | HR 76 | Ht 63.0 in | Wt 132.0 lb

## 2019-12-06 DIAGNOSIS — R7302 Impaired glucose tolerance (oral): Secondary | ICD-10-CM | POA: Diagnosis not present

## 2019-12-06 LAB — POCT GLYCOSYLATED HEMOGLOBIN (HGB A1C): Hemoglobin A1C: 5.1 % (ref 4.0–5.6)

## 2019-12-06 NOTE — Progress Notes (Signed)
Patient ID: Jasmine Bell, female   DOB: [birth year], 34 y.o.   MRN: [id number]   This visit occurred during the SARS-CoV-2 public health emergency.  Safety protocols were in place, including screening questions prior to the visit, additional usage of staff PPE, and extensive cleaning of exam room while observing appropriate contact time as indicated for disinfecting solutions.   HPI: Jasmine Bell is a 34 y.o.-year-old female, referred by Paula Compton, MD, for management of history of GDM, now postpartum, with impaired glucose tolerance.  Pt has a history of infertility and had IVF last year.  She gave birth on 09/23/2019 (C-section) to an 8 pound 10 ounce healthy son.  She is currently on OCPs.  During this pregnancy, she had gestational diabetes, with 2 abnormal OGTT test.  Results were reviewed per OB/GYN records.  07/09/2019: 50 g glucose solution: Glucose 174 (65-1 39)  07/17/2019: 100 g glucose solution: Fasting: Glucose 70 (65-94) 1-hour: Glucose 145 (65-179) 2-hour: Glucose 138 (65-154) 3-hour: Glucose 148 (55-139)  08/20/2019: 100 g glucose solution: Fasting: Glucose 80 (65-94) 1-hour: Glucose 213 (65-179) 2-hour: Glucose 185 (65-154) 3-hour: Glucose 115 (55-139)  6 weeks postpartum:  - abnormal (abnormal) - 150 (55-139)  No HbA1c levels available for review: No results found for: HGBA1C  She is not on any medications for her hyperglycemia.  She is not checking sugars.  She denies any symptoms of hypoglycemia including increased urination, increased thirst, increased hunger, blurry vision, unexpected weight loss.  She is breast-feeding.  Pt's meals are: - Breakfast: [place] yogurt, sausage, eggs, fruit, coffee - Lunch: Plant-based burger, salad, chicken/tuna salad - Dinner: [place] meat loaf, chicken, pork chops, [place] chili, tacos in blue corn shells - Snacks: 3 a day-celery and peanut butter, yogurt, nuts, carrots, tomatoes, cheese She drinks 1 glass of  wine every other night. For exercise, she jogs 5 times a week.  - No CKD, last BUN/creatinine:  Lab Results  Component Value Date   BUN 9 01/12/2018   BUN 9 08/28/2013   CREATININE 0.58 01/12/2018   CREATININE 0.58 08/28/2013   - No HL; last set of lipids: Lab Results  Component Value Date   CHOL 173 01/12/2018   HDL 66 01/12/2018   LDLCALC 95 01/12/2018   TRIG 60 01/12/2018   CHOLHDL 2.6 01/12/2018   - last eye exam was "years ago" - normal.  - no numbness and tingling in her feet.  Pt has FH of DM in P uncle, PGM. Father and PGF with prediabetes.  She also has another 42-1/2 years old child.  She has a history of 3 miscarriages/retained placental parts in 2017, 18, 19.  She is a Immunologist in anesthesia.  ROS: Constitutional: no weight gain, + post pregnancy weight loss, no fatigue, no subjective hyperthermia, no subjective hypothermia, no nocturia, + poor sleep (newborn) Eyes: no blurry vision, no xerophthalmia ENT: no sore throat, no nodules palpated in neck, no dysphagia, no odynophagia, no hoarseness, no tinnitus, no hypoacusis Cardiovascular: no CP, no SOB, no palpitations, no leg swelling Respiratory: no cough, no SOB, no wheezing Gastrointestinal: no N, no V, no D, no C, no acid reflux Musculoskeletal: no muscle, no joint aches Skin: no rash, no hair loss Neurological: no tremors, no numbness or tingling/no dizziness/no HAs Psychiatric: no depression, no anxiety  Past Medical History:  Diagnosis Date  . Anxiety   . Gestational diabetes   . History of infertility   . Infertility, female   . Newborn product of IVF pregnancy   .  SVD (spontaneous vaginal delivery)    x 1   Past Surgical History:  Procedure Laterality Date  . aerolar cyst on breast removed    . BREAST SURGERY     areolar cyst removal  . CESAREAN SECTION N/A 09/23/2019   Procedure: CESAREAN SECTION;  Surgeon: Paula Compton, MD;  Location: [facility];  Service: Obstetrics;  Laterality: N/A;  .  DILATION AND CURETTAGE OF UTERUS    . DILATION AND EVACUATION  04/12/2016   Procedure: DILATATION AND EVACUATION;  Surgeon: Cheri Fowler, MD;  Location: [facility];  Service: Gynecology;;  . Brigitte Pulse AND EVACUATION N/A 05/17/2017   Procedure: DILATATION AND EVACUATION;  Surgeon: Paula Compton, MD;  Location: [facility];  Service: Gynecology;  Laterality: N/A;  . DILATION AND EVACUATION N/A 04/10/2018   Procedure: DILATATION AND EVACUATION;  Surgeon: Paula Compton, MD;  Location: [facility];  Service: Gynecology;  Laterality: N/A;  . WISDOM TOOTH EXTRACTION     Social History   Socioeconomic History  . Marital status: Married    Spouse name: Not on file  . Number of children: 1  . Years of education: Not on file  . Highest education level: Master's degree (e.g., MA, MS, MEng, MEd, MSW, MBA)  Occupational History  . Occupation: Immunologist  Tobacco Use  . Smoking status: Never Smoker  . Smokeless tobacco: Never Used  Substance and Sexual Activity  . Alcohol use: Yes    Comment: socially  . Drug use: No  . Sexual activity: Yes    Partners: Male    Birth control/protection: None    Comment: approx [redacted] wks gestation  Other Topics Concern  . Not on file  Social History Narrative   Pt is from [place]. Lives in [place]. Works as Immunologist. Lives at home with husband and 52 yo son.    Social Determinants of Health   Financial Resource Strain:   . Difficulty of Paying Living Expenses:   Food Insecurity:   . Worried About Charity fundraiser in the Last Year:   . Arboriculturist in the Last Year:   Transportation Needs:   . Film/video editor (Medical):   [name] Lack of Transportation (Non-Medical):   Physical Activity:   . Days of Exercise per Week:   . Minutes of Exercise per Session:   Stress:   . Feeling of Stress :   Social Connections:   . Frequency of Communication with Friends and Family:   . Frequency of Social Gatherings with Friends and Family:   . Attends Religious  Services:   . Active Member of Clubs or Organizations:   . Attends Archivist Meetings:   [name] Marital Status:   Intimate Partner Violence:   . Fear of Current or Ex-Partner:   . Emotionally Abused:   [name] Physically Abused:   . Sexually Abused:    Current Outpatient Medications on File Prior to Visit  Medication Sig Dispense Refill  . HEATHER 0.35 MG tablet Take 1 tablet by mouth daily.     No current facility-administered medications on file prior to visit.   No Known Allergies Family History  Problem Relation Age of Onset  . Depression Father   . Mental illness Father   . Breast cancer Maternal Aunt   . Diabetes Paternal Aunt   . Testicular cancer Maternal Grandfather   . Bipolar disorder Paternal Grandfather   . Heart disease Paternal Grandfather   . Hyperlipidemia Paternal Grandfather   . Cancer Maternal Grandmother   .  Stroke Maternal Grandmother   . Mental illness Maternal Grandmother     PE: BP 110/70   Pulse 76   Ht 5\' 3"  (1.6 m)   Wt 132 lb (59.9 kg)   SpO2 98%   Breastfeeding Yes Comment: pumping  BMI 23.38 kg/m  Wt Readings from Last 3 Encounters:  12/06/19 132 lb (59.9 kg)  09/23/19 152 lb (68.9 kg)  06/25/18 129 lb 12.8 oz (58.9 kg)   Constitutional: normal weight, in NAD Eyes: PERRLA, EOMI, no exophthalmos ENT: moist mucous membranes, no thyromegaly, no cervical lymphadenopathy Cardiovascular: RRR, No MRG Respiratory: CTA B Gastrointestinal: abdomen soft, NT, ND, BS+ Musculoskeletal: no deformities, strength intact in all 4 Skin: moist, warm, no rashes Neurological: no tremor with outstretched hands, DTR normal in all 4  ASSESSMENT: 1.  Impaired glucose tolerance - History of gestational diabetes  PLAN:  1. Patient with history of gestational diabetes during her recent pregnancy, with abnormal OGTT x3 (reviewed) during the pregnancy and 1 abnormal OGTT obtained 1.5 months after giving birth (reportedly).  At the time of the last OGTT,  she was told to follow-up with PCP to see if she would need medication.  She decided to obtain an endocrinology consult to see if there is anything that she can do to prevent development of prediabetes/diabetes other than medications. -At today's visit, we checked an HbA1c: 5.1%, which is excellent.  We discussed about HbA1c ranges for prediabetes and diabetes and I explained that as of now, this is in the normal range.  We did discuss that HbA1c is not extremely accurate at low levels, however, at 5.1%, this is very reassuring. -At today's visit we discussed about the concept of insulin resistance and what makes the sugars increase.  We reviewed her diet almost meal by meal and I made specific suggestions about improving her diet.  I explained that high-fat foods will increase her insulin resistance and promote development of diabetes, hypertension, heart disease, cancer, etc. I suggested a transition towards a more whole food plant-based diet.  Explained the advantages.  I gave her references and she appears to be very interested in the diet.  I also offered to refer her to nutrition with to help her more with the diet, but for now, she would want to research the materials by herself and will let me know afterwards if she is interested in a possible referral. -We also discussed about the importance of exercise and how this helps decrease insulin resistance and risk of prediabetes/diabetes. -At this visit, I did not suggest to start any medication, but we did discuss that Metformin has been shown to decrease the risk of progression towards diabetes.  I also do not feel that she absolutely needs to start checking her sugars for now. - She is not up-to-date with eye exams  - Return to clinic in 4 months-we will check another HbA1c at that time  Philemon Kingdom, MD PhD [facility]

## 2019-12-06 NOTE — Addendum Note (Signed)
Addended by: Cardell Peach I on: 12/06/2019 01:12 PM   Modules accepted: Orders

## 2019-12-06 NOTE — Patient Instructions (Addendum)
Your HbA1c today is 5.1%.  Plant-based diet materials:  - Lectures (you tube):  Dr. Alyssa Grove: Breaking the Food Seduction  Dr. Layla Maw: How to Lose Weight, without Losing Your Mind  Dr. Legrand Como Greger: How Not To Die: The Role of Diet in Preventing, Arresting, and Reversing Our Top 15 Killers   - Documentaries:  Supersize Me  Forks over [employer]  What the Health  - Books: Dr. Alyssa Grove - Program for Reversing Diabetes Rip Cyd Silence - The Engine 2 diet  - Facebook pages:   Moshe Salis[place] versus [place]  - Programs:  The Engine 2 Seven-Day Rescue Diet  PCRM website  Please come back for a follow-up appointment in 4 months.

## 2020-01-08 ENCOUNTER — Emergency Department (HOSPITAL_COMMUNITY): Payer: 59

## 2020-01-08 ENCOUNTER — Other Ambulatory Visit: Payer: Self-pay

## 2020-01-08 ENCOUNTER — Emergency Department (HOSPITAL_COMMUNITY)
Admission: EM | Admit: 2020-01-08 | Discharge: 2020-01-08 | Disposition: A | Discharge status: Home / Self Care | Payer: 59 | Attending: Emergency Medicine | Admitting: Emergency Medicine

## 2020-01-08 ENCOUNTER — Other Ambulatory Visit: Payer: Self-pay | Admitting: Physician Assistant

## 2020-01-08 DIAGNOSIS — R42 Dizziness and giddiness: Secondary | ICD-10-CM | POA: Insufficient documentation

## 2020-01-08 DIAGNOSIS — Z79899 Other long term (current) drug therapy: Secondary | ICD-10-CM | POA: Insufficient documentation

## 2020-01-08 DIAGNOSIS — E041 Nontoxic single thyroid nodule: Secondary | ICD-10-CM | POA: Insufficient documentation

## 2020-01-08 DIAGNOSIS — R000 Tachycardia, unspecified: Secondary | ICD-10-CM | POA: Diagnosis present

## 2020-01-08 DIAGNOSIS — I480 Paroxysmal atrial fibrillation: Secondary | ICD-10-CM

## 2020-01-08 DIAGNOSIS — I4891 Unspecified atrial fibrillation: Secondary | ICD-10-CM | POA: Diagnosis not present

## 2020-01-08 LAB — CBC WITH DIFFERENTIAL/PLATELET
Abs Immature Granulocytes: 0.03 10*3/uL (ref 0.00–0.07)
Basophils Absolute: 0 10*3/uL (ref 0.0–0.1)
Basophils Relative: 0 %
Eosinophils Absolute: 0.1 10*3/uL (ref 0.0–0.5)
Eosinophils Relative: 1 %
HCT: 41.8 % (ref 36.0–46.0)
Hemoglobin: 13 g/dL (ref 12.0–15.0)
Immature Granulocytes: 0 %
Lymphocytes Relative: 24 %
Lymphs Abs: 1.7 10*3/uL (ref 0.7–4.0)
MCH: 27.3 pg (ref 26.0–34.0)
MCHC: 31.1 g/dL (ref 30.0–36.0)
MCV: 87.6 fL (ref 80.0–100.0)
Monocytes Absolute: 0.6 10*3/uL (ref 0.1–1.0)
Monocytes Relative: 9 %
Neutro Abs: 4.4 10*3/uL (ref 1.7–7.7)
Neutrophils Relative %: 66 %
Platelets: 213 10*3/uL (ref 150–400)
RBC: 4.77 MIL/uL (ref 3.87–5.11)
RDW: 16 % — ABNORMAL HIGH (ref 11.5–15.5)
WBC: 6.9 10*3/uL (ref 4.0–10.5)
nRBC: 0 % (ref 0.0–0.2)

## 2020-01-08 LAB — COMPREHENSIVE METABOLIC PANEL
ALT: 16 U/L (ref 0–44)
AST: 21 U/L (ref 15–41)
Albumin: 4 g/dL (ref 3.5–5.0)
Alkaline Phosphatase: 50 U/L (ref 38–126)
Anion gap: 12 (ref 5–15)
BUN: 7 mg/dL (ref 6–20)
CO2: 23 mmol/L (ref 22–32)
Calcium: 9.2 mg/dL (ref 8.9–10.3)
Chloride: 103 mmol/L (ref 98–111)
Creatinine, Ser: 0.7 mg/dL (ref 0.44–1.00)
GFR calc Af Amer: >60 mL/min (ref >60)
GFR calc non Af Amer: >60 mL/min (ref >60)
Glucose, Bld: 93 mg/dL (ref 70–99)
Potassium: 3.5 mmol/L (ref 3.5–5.1)
Sodium: 138 mmol/L (ref 135–145)
Total Bilirubin: 1.1 mg/dL (ref 0.3–1.2)
Total Protein: 6.5 g/dL (ref 6.5–8.1)

## 2020-01-08 LAB — I-STAT BETA HCG BLOOD, ED (MC, WL, AP ONLY): I-stat hCG, quantitative: <5 m[IU]/mL (ref <5)

## 2020-01-08 LAB — CBG MONITORING, ED: Glucose-Capillary: 78 mg/dL (ref 70–99)

## 2020-01-08 LAB — D-DIMER, QUANTITATIVE: D-Dimer, Quant: 0.53 ug{FEU}/mL — ABNORMAL HIGH (ref 0.00–0.50)

## 2020-01-08 LAB — TROPONIN I (HIGH SENSITIVITY)
Troponin I (High Sensitivity): 2 ng/L (ref <18)
Troponin I (High Sensitivity): 6 ng/L (ref <18)

## 2020-01-08 LAB — MAGNESIUM: Magnesium: 1.6 mg/dL — ABNORMAL LOW (ref 1.7–2.4)

## 2020-01-08 LAB — TSH: TSH: 2.066 u[IU]/mL (ref 0.350–4.500)

## 2020-01-08 MED ORDER — IOHEXOL 350 MG/ML SOLN
100.0000 mL | Freq: Once | INTRAVENOUS | Status: AC | PRN
  Administered 2020-01-08: 70 mL via INTRAVENOUS

## 2020-01-08 MED ORDER — DILTIAZEM HCL 25 MG/5ML IV SOLN
10.0000 mg | Freq: Once | INTRAVENOUS | Status: AC
  Administered 2020-01-08: 10 mg via INTRAVENOUS
  Filled 2020-01-08: qty 5

## 2020-01-08 MED ORDER — DILTIAZEM HCL 30 MG PO TABS
30.0000 mg | ORAL_TABLET | Freq: Four times a day (QID) | ORAL | 3 refills | Status: DC | PRN
Start: 2020-01-08 — End: 2022-08-09

## 2020-01-08 MED ORDER — SODIUM CHLORIDE 0.9 % IV BOLUS
1000.0000 mL | Freq: Once | INTRAVENOUS | Status: AC
  Administered 2020-01-08: 1000 mL via INTRAVENOUS

## 2020-01-08 NOTE — Discharge Instructions (Signed)
Cardiology office will call you with an appointment for follow-up.  Follow-up outpatient with your primary care provider for your thyroid nodule  Cardiology is called you in a prescription for Cardizem  Return for any worsening symptoms

## 2020-01-08 NOTE — ED Triage Notes (Signed)
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  Pt given 800 ml NS. No heart hx, KNA, No meds. Pt had coffee this morning and felt fine prior to incident.  Denies CP, SOB, still feels racing. Pt noted to be in afib 120-170. Pt ~43mths postpartum, having had 2 rounds of IVF and gestational diabetes.

## 2020-01-08 NOTE — ED Provider Notes (Addendum)
[facility] EMERGENCY DEPARTMENT Provider Note   CSN: [id number] Arrival date & time: 01/08/20  7322    History Chief Complaint  Patient presents with  . Dizziness  . Tachycardia    Jasmine Bell is a 34 y.o. female with past medical history significant for recent delivery s/p 4 months ago with C-section, recent diagnosis of glucose intolerance who presents for evaluation of palpitations.  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  Patient states she is doing her normal activities when she bent down and stood up quickly.  Patient states she became lightheaded and dizzy and had palpitations.  No prior symptoms of this.  Did recently go vegan.  States she had coffee this morning however excessive caffeine use.  No prior history of cardiopulmonary issues.  No leg swelling.  She denies any chest pain, shortness of breath however does still admit to palpitations.  States her dizziness improved after fluid bolus by EMS.  No recent trauma.  Denies additional aggravating relieving factors.  No fever, chills abdominal pain unilateral leg swelling, redness or warmth.  States systolic blood pressure is normally in the 90s.   History obtained from patient and past medical records.  No interpreter is used.  HPI     Past Medical History:  Diagnosis Date  . Anxiety   . Gestational diabetes   . History of infertility   . Infertility, female   . Newborn product of IVF pregnancy   . SVD (spontaneous vaginal delivery)    x 1    Patient Active Problem List   Diagnosis Date Noted  . Impaired glucose tolerance 12/06/2019  . Indication for care in labor and delivery, antepartum 09/23/2019  . Status post primary low transverse cesarean section 09/23/2019  . History of infertility 02/12/2016    Past Surgical History:  Procedure Laterality Date  . aerolar cyst on breast removed    . BREAST SURGERY     areolar cyst removal  . CESAREAN SECTION N/A 09/23/2019   Procedure: CESAREAN  SECTION;  Surgeon: Paula Compton, MD;  Location: [facility];  Service: Obstetrics;  Laterality: N/A;  . DILATION AND CURETTAGE OF UTERUS    . DILATION AND EVACUATION  04/12/2016   Procedure: DILATATION AND EVACUATION;  Surgeon: Cheri Fowler, MD;  Location: [facility];  Service: Gynecology;;  . Brigitte Pulse AND EVACUATION N/A 05/17/2017   Procedure: DILATATION AND EVACUATION;  Surgeon: Paula Compton, MD;  Location: [facility];  Service: Gynecology;  Laterality: N/A;  . DILATION AND EVACUATION N/A 04/10/2018   Procedure: DILATATION AND EVACUATION;  Surgeon: Paula Compton, MD;  Location: [facility];  Service: Gynecology;  Laterality: N/A;  . WISDOM TOOTH EXTRACTION       OB History    Gravida  4   Para  2   Term  2   Preterm      AB  2   Living  2     SAB  2   TAB      Ectopic      Multiple  0   Live Births  2           Family History  Problem Relation Age of Onset  . Depression Father   . Mental illness Father   . Breast cancer Maternal Aunt   . Diabetes Paternal Aunt   . Testicular cancer Maternal Grandfather   . Bipolar disorder Paternal Grandfather   . Heart disease Paternal Grandfather   . Hyperlipidemia Paternal Grandfather   .  Cancer Maternal Grandmother   . Stroke Maternal Grandmother   . Mental illness Maternal Grandmother     Social History   Tobacco Use  . Smoking status: Never Smoker  . Smokeless tobacco: Never Used  Vaping Use  . Vaping Use: Never used  Substance Use Topics  . Alcohol use: Yes    Comment: socially  . Drug use: No    Home Medications Prior to Admission medications   Medication Sig Start Date End Date Taking? Authorizing Provider  diltiazem (CARDIZEM) 30 MG tablet Take 1 tablet (30 mg total) by mouth every 6 (six) hours as needed (for elevated heart rate). 01/08/20 01/07/21  Bhagat, Bhavinkumar, PA  HEATHER 0.35 MG tablet Take 1 tablet by mouth daily. 11/04/19   [provider]    Allergies    Patient has no known  allergies.  Review of Systems   Review of Systems  Constitutional: Negative.   HENT: Negative.   Respiratory: Negative.   Cardiovascular: Positive for palpitations. Negative for chest pain.  Gastrointestinal: Negative.   Genitourinary: Negative.   Musculoskeletal: Negative.   Skin: Negative.   Neurological: Positive for dizziness and light-headedness. Negative for tremors, syncope, speech difficulty, weakness, numbness and headaches.  All other systems reviewed and are negative.   Physical Exam Updated Vital Signs BP 98/70   Pulse 77   Temp 98.4 F (36.9 C) (Oral)   Resp 14   Ht 5\' 3"  (1.6 m)   Wt 58.1 kg   SpO2 99%   BMI 22.67 kg/m   Physical Exam Vitals and nursing note reviewed.  Constitutional:      General: She is not in acute distress.    Appearance: She is well-developed. She is not ill-appearing, toxic-appearing or diaphoretic.  HENT:     Head: Atraumatic.     Nose: Nose normal.     Mouth/Throat:     Mouth: Mucous membranes are moist.  Eyes:     Pupils: Pupils are equal, round, and reactive to light.  Cardiovascular:     Rate and Rhythm: Tachycardia present. Rhythm irregular.     Pulses: Normal pulses.     Heart sounds: Normal heart sounds.  Pulmonary:     Effort: Pulmonary effort is normal. No respiratory distress.     Breath sounds: Normal breath sounds.     Comments: Speaks in full sentences without difficulty.  Clear to auscultation bilaterally. Abdominal:     General: Bowel sounds are normal. There is no distension.     Tenderness: There is no abdominal tenderness. There is no right CVA tenderness, left CVA tenderness, guarding or rebound.     Comments: Soft, nontender without rebound or guarding  Musculoskeletal:        General: Normal range of motion.     Cervical back: Normal range of motion.     Comments: No bony tenderness.  Moves all 4 extremities without difficulty.  Skin:    General: Skin is warm and dry.     Capillary Refill: Capillary  refill takes less than 2 seconds.     Comments: No edema, erythema or warmth.  No fluctuance or induration.  No unilateral leg swelling, redness or warmth  Neurological:     General: No focal deficit present.     Mental Status: She is alert and oriented to person, place, and time.    ED Results / Procedures / Treatments   Labs (all labs ordered are listed, but only abnormal results are displayed) Labs Reviewed  CBC  WITH DIFFERENTIAL/PLATELET - Abnormal; Notable for the following components:      Result Value   RDW 16.0 (*)    All other components within normal limits  D-DIMER, QUANTITATIVE (NOT AT [facility]) - Abnormal; Notable for the following components:   D-Dimer, Quant 0.53 (*)    All other components within normal limits  MAGNESIUM - Abnormal; Notable for the following components:   Magnesium 1.6 (*)    All other components within normal limits  COMPREHENSIVE METABOLIC PANEL  TSH  CBG MONITORING, ED  I-STAT BETA HCG BLOOD, ED ([facility], [facility], [facility] ONLY)  TROPONIN I (HIGH SENSITIVITY)  TROPONIN I (HIGH SENSITIVITY)    EKG EKG Interpretation  Date/Time:  Wednesday January 08 2020 11:18:34 EDT Ventricular Rate:  83 PR Interval:    QRS Duration: 98 QT Interval:  374 QTC Calculation: 440 R Axis:   -4 Text Interpretation: Normal sinus rhythm Normal ECG Confirmed by Veryl Speak [phone]) on 01/08/2020 12:53:00 PM     Radiology CT Angio Chest PE W/Cm &/Or Wo Cm  Result Date: 01/08/2020 CLINICAL DATA:  Onset tachycardia and atrial fibrillation today. EXAM: CT ANGIOGRAPHY CHEST WITH CONTRAST TECHNIQUE: Multidetector CT imaging of the chest was performed using the standard protocol during bolus administration of intravenous contrast. Multiplanar CT image reconstructions and MIPs were obtained to evaluate the vascular anatomy. CONTRAST:  70 mL OMNIPAQUE IOHEXOL 350 MG/ML SOLN COMPARISON:  Single-view of the chest earlier today. FINDINGS: Cardiovascular: Satisfactory opacification of the pulmonary  arteries to the segmental level. No evidence of pulmonary embolism. Normal heart size. No pericardial effusion. Mediastinum/Nodes: No lymphadenopathy. The esophagus appears normal. Low attenuating lesion in left lobe of the thyroid gland measures 0.9 x 0.9 x 1.7 cm. Lungs/Pleura: Lungs clear.  No pleural or pericardial effusion. Upper Abdomen: Negative. Musculoskeletal: Negative. Review of the MIP images confirms the above findings. IMPRESSION: Negative for pulmonary embolus.  No acute abnormality. 1.7 cm lesion left lobe of the thyroid. Recommend thyroid US.(Ref: J Am Coll Radiol. 2015 Feb;12(2): 143-50). Electronically Signed   By: Inge Rise M.D.   On: 01/08/2020 13:25   DG Chest Portable 1 View  Result Date: 01/08/2020 CLINICAL DATA:  Chest pain.  Tachycardia.  Atrial fibrillation. EXAM: PORTABLE CHEST 1 VIEW COMPARISON:  None. FINDINGS: The heart size and mediastinal contours are within normal limits. Both lungs are clear. The visualized skeletal structures are unremarkable. IMPRESSION: No active disease. Electronically Signed   By: Van Clines M.D.   On: 01/08/2020 10:46    Procedures .Critical Care Performed by: Nettie Elm, PA-C Authorized by: Nettie Elm, PA-C   Critical care provider statement:    Critical care time (minutes):  35   Critical care was necessary to treat or prevent imminent or life-threatening deterioration of the following conditions:  Cardiac failure (New onset Afib with RVR)   Critical care was time spent personally by me on the following activities:  Discussions with consultants, evaluation of patient's response to treatment, examination of patient, ordering and performing treatments and interventions, ordering and review of laboratory studies, ordering and review of radiographic studies, pulse oximetry, re-evaluation of patient's condition, obtaining history from patient or surrogate and review of old charts   (including critical care  time)  Medications Ordered in ED Medications  diltiazem (CARDIZEM) injection 10 mg (10 mg Intravenous Given 01/08/20 0934)  sodium chloride 0.9 % bolus 1,000 mL (0 mLs Intravenous Stopped 01/08/20 1045)  iohexol (OMNIPAQUE) 350 MG/ML injection 100 mL (70 mLs Intravenous Contrast Given  01/08/20 1256)   ED Course  I have reviewed the triage vital signs and the nursing notes.  Pertinent labs & imaging results that were available during my care of the patient were reviewed by me and considered in my medical decision making (see chart for details).  34 year old female presents for evaluation of palpitations.  She is afebrile, nonseptic, not ill-appearing.  Recent C-section delivery 4 months ago.  Onset of palpitations just prior to arrival.  No unilateral leg swelling, redness or warmth.  No prior history of PE or DVT.  No prior history of arrhythmias.  Patient with new onset A. fib with RVR.  We will plan to check on some labs.  Has mildly soft blood pressures.  Will give additional fluid bolus as well as attempt 1 dose of Cardizem.  Initial EKG not pulling into computer.  See attached picture  Patient seen by attending, Dr. Stark Jock.  Discussed mildly elevated D-dimer.  He states to hold on CTA of chest.  He has consulted with cardiology.  He states to hold on Cardizem drip given for low systolic blood pressure and also hold on cardioversion until cardiology has evaluated.  Labs and imaging personally reviewed and interpreted: CBC without leukocytosis, hemoglobin 83.6 Metabolic panel with electrolyte, renal or liver normality TSH 2.06 Delta troponin flat Pregnancy test negative Magnesium slightly low at 1.6 D-dimer elevated 0.53 however follow-up CTA of chest negative for PE.  She does have thyroid nodule.  Patient aware this will follow-up outpatient with PCP  1118: Patient spontaneously converted. EKG obtained. Plan to have cards evaluate and determine disposition.  1400: She has been assessed by  cardiology.  They recommend follow-up outpatient for echo.  And they have sent in prescription for Cardizem.  Patient will return for any worsening symptoms.   The patient has been appropriately medically screened and/or stabilized in the ED. I have low suspicion for any other emergent medical condition which would require further screening, evaluation or treatment in the ED or require inpatient management.  Patient is hemodynamically stable and in no acute distress.  Patient able to ambulate in department prior to ED.  Evaluation does not show acute pathology that would require ongoing or additional emergent interventions while in the emergency department or further inpatient treatment.  I have discussed the diagnosis with the patient and answered all questions.  Pain is been managed while in the emergency department and patient has no further complaints prior to discharge.  Patient is comfortable with plan discussed in room and is stable for discharge at this time.  I have discussed strict return precautions for returning to the emergency department.  Patient was encouraged to follow-up with PCP/specialist refer to at discharge.      MDM Rules/Calculators/A&P     CHA2DS2-VASc Score: 1                     CHA2DS2-VASc score 1 Final Clinical Impression(s) / ED Diagnoses Final diagnoses:  New onset a-fib ([place])  Thyroid nodule    Rx / DC Orders ED Discharge Orders         Ordered    diltiazem (CARDIZEM) 30 MG tablet  Every 6 hours PRN     Discontinue  Reprint     01/08/20 1343           Madalynn Pickelsimer A, PA-C 01/08/20 1412    Veryl Speak, MD 01/08/20 1546    Michaeljames Milnes A, PA-C 01/08/20 1729    [place], [place],  MD 01/10/20 2305

## 2020-01-08 NOTE — ED Notes (Signed)
Notified EDP of pt hr and bp.

## 2020-01-08 NOTE — Consult Note (Addendum)
Cardiology Consultation:   Patient ID: ETHAL Bell MRN: [id number]; DOB: Aug 28, 1985  Admit date: 01/08/2020 Date of Consult: 01/08/2020  Primary Care Provider: Forrest Moron, MD [place] Cardiologist:New    Patient Profile:   Jasmine Bell is a 34 y.o. female with a hx of VSD as child and recent gestational diabetes who is being seen today for the evaluation of Afib RVR at the request of Dr. Marlou Porch.   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  No family history of premature CAD or death.  History of Present Illness:   Jasmine Bell was in usual state of health up until this morning while at work she had sudden onset lightheadedness with palpitation while trying to stood up quickly.  She was found to be in A. fib RVR.  No chest pain, shortness of breath, orthopnea, PND or syncope.  She was given IV Cardizem 10 mg with spontaneous conversion to sinus rhythm within 2 hours.  Currently maintaining sinus rhythm.  Denies prior history of atrial fibrillation or tachycardia.  Reports she occasionally has PVC/PAC lasting less than 1 minute.  No prior syncope.  No regular exercise but active taking care of 2 kids.  D-dimer minimally elevated.  Pending CT angio of the chest.  TSH normal.  Patient had IVF last year and deliver healthy son 09/23/2019 by C-section.  She had a gestational diabetes.  She continues to have impaired glucose intolerance and establish care with endocrinologist last month.  Dietary changes recommended.  No issue with first pregnancy.  Past Medical History:  Diagnosis Date  . Anxiety   . Gestational diabetes   . History of infertility   . Infertility, female   . Newborn product of IVF pregnancy   . SVD (spontaneous vaginal delivery)    x 1    Past Surgical History:  Procedure Laterality Date  . aerolar cyst on breast removed    . BREAST SURGERY     areolar cyst removal    . CESAREAN SECTION N/A 09/23/2019   Procedure: CESAREAN SECTION;  Surgeon: Jasmine Compton, MD;  Location: [facility];  Service: Obstetrics;  Laterality: N/A;  . DILATION AND CURETTAGE OF UTERUS    . DILATION AND EVACUATION  04/12/2016   Procedure: DILATATION AND EVACUATION;  Surgeon: Jasmine Fowler, MD;  Location: [facility];  Service: Gynecology;;  . Brigitte Pulse AND EVACUATION N/A 05/17/2017   Procedure: DILATATION AND EVACUATION;  Surgeon: Jasmine Compton, MD;  Location: [facility];  Service: Gynecology;  Laterality: N/A;  . DILATION AND EVACUATION N/A 04/10/2018   Procedure: DILATATION AND EVACUATION;  Surgeon: Jasmine Compton, MD;  Location: [facility];  Service: Gynecology;  Laterality: N/A;  . WISDOM TOOTH EXTRACTION       Inpatient Medications: Scheduled Meds:  Continuous Infusions:  PRN Meds:   Allergies:   No Known Allergies  Social History:   Social History   Socioeconomic History  . Marital status: Married    Spouse name: Not on file  . Number of children: 1  . Years of education: Not on file  . Highest education level: Master's degree (e.g., MA, MS, MEng, MEd, MSW, MBA)  Occupational History  . Occupation: Immunologist  Tobacco Use  . Smoking status: Never Smoker  . Smokeless tobacco: Never Used  Vaping Use  . Vaping Use: Never used  Substance and Sexual Activity  . Alcohol use: Yes    Comment: socially  .  Drug use: No  . Sexual activity: Yes    Partners: Male    Birth control/protection: None    Comment: approx [redacted] wks gestation  Other Topics Concern  . Not on file  Social History Narrative   Pt is from [place]. Lives in [place]. Works as Immunologist. Lives at home with husband and 53 yo son.    Social Determinants of Health   Financial Resource Strain:   . Difficulty of Paying Living Expenses:   Food Insecurity:   . Worried About Charity fundraiser in the Last Year:   . Arboriculturist in the Last Year:   Transportation Needs:   . Film/video editor  (Medical):   [name] Lack of Transportation (Non-Medical):   Physical Activity:   . Days of Exercise per Week:   . Minutes of Exercise per Session:   Stress:   . Feeling of Stress :   Social Connections:   . Frequency of Communication with Friends and Family:   . Frequency of Social Gatherings with Friends and Family:   . Attends Religious Services:   . Active Member of Clubs or Organizations:   . Attends Archivist Meetings:   [name] Marital Status:   Intimate Partner Violence:   . Fear of Current or Ex-Partner:   . Emotionally Abused:   [name] Physically Abused:   . Sexually Abused:     Family History:   Family History  Problem Relation Age of Onset  . Depression Father   . Mental illness Father   . Breast cancer Maternal Aunt   . Diabetes Paternal Aunt   . Testicular cancer Maternal Grandfather   . Bipolar disorder Paternal Grandfather   . Heart disease Paternal Grandfather   . Hyperlipidemia Paternal Grandfather   . Cancer Maternal Grandmother   . Stroke Maternal Grandmother   . Mental illness Maternal Grandmother      ROS:  Please see the history of present illness.  All other ROS reviewed and negative.     Physical Exam/Data:   Vitals:   01/08/20 1130 01/08/20 1145 01/08/20 1200 01/08/20 1417  BP: 100/78 103/66 98/70 106/66  Pulse: 86 82 77 76  Resp: (!) 21 11 14 18   Temp:    98.4 F (36.9 C)  TempSrc:    Oral  SpO2: 100% 100% 99% 100%  Weight:      Height:       No intake or output data in the 24 hours ending 01/08/20 1418 Last 3 Weights 01/08/2020 12/06/2019 09/23/2019  Weight (lbs) 128 lb 132 lb 152 lb  Weight (kg) 58.06 kg 59.875 kg 68.947 kg  Some encounter information is confidential and restricted. Go to Review Flowsheets activity to see all data.     Body mass index is 22.67 kg/m.  General:  Well nourished, well developed, in no acute distress HEENT: normal Lymph: no adenopathy Neck: no JVD Endocrine:  No thryomegaly Vascular: No carotid bruits;  FA pulses 2+ bilaterally without bruits  Cardiac:  normal S1, S2; RRR; no murmur  Lungs:  clear to auscultation bilaterally, no wheezing, rhonchi or rales  Abd: soft, nontender, no hepatomegaly  Ext: no edema Musculoskeletal:  No deformities, BUE and BLE strength normal and equal Skin: warm and dry  Neuro:  CNs 2-12 intact, no focal abnormalities noted Psych:  Normal affect   EKG:  The EKG was personally reviewed and demonstrates: Normal sinus rhythm Telemetry:  Telemetry was personally reviewed and demonstrates: Atrial fibrillation with  rapid ventricular rate>> sinus rhythm  Relevant CV Studies: As above  Laboratory Data:  High Sensitivity Troponin:   Recent Labs  Lab 01/08/20 0928 01/08/20 1125  TROPONINIHS 2 6     Chemistry Recent Labs  Lab 01/08/20 0928  NA 138  K 3.5  CL 103  CO2 23  GLUCOSE 93  BUN 7  CREATININE 0.70  CALCIUM 9.2  GFRNONAA >60  GFRAA >60  ANIONGAP 12    Recent Labs  Lab 01/08/20 0928  PROT 6.5  ALBUMIN 4.0  AST 21  ALT 16  ALKPHOS 50  BILITOT 1.1   Hematology Recent Labs  Lab 01/08/20 0928  WBC 6.9  RBC 4.77  HGB 13.0  HCT 41.8  MCV 87.6  MCH 27.3  MCHC 31.1  RDW 16.0*  PLT 213   DDimer  Recent Labs  Lab 01/08/20 0928  DDIMER 0.53*    Radiology/Studies:  CT Angio Chest PE W/Cm &/Or Wo Cm  Result Date: 01/08/2020 CLINICAL DATA:  Onset tachycardia and atrial fibrillation today. EXAM: CT ANGIOGRAPHY CHEST WITH CONTRAST TECHNIQUE: Multidetector CT imaging of the chest was performed using the standard protocol during bolus administration of intravenous contrast. Multiplanar CT image reconstructions and MIPs were obtained to evaluate the vascular anatomy. CONTRAST:  70 mL OMNIPAQUE IOHEXOL 350 MG/ML SOLN COMPARISON:  Single-view of the chest earlier today. FINDINGS: Cardiovascular: Satisfactory opacification of the pulmonary arteries to the segmental level. No evidence of pulmonary embolism. Normal heart size. No pericardial  effusion. Mediastinum/Nodes: No lymphadenopathy. The esophagus appears normal. Low attenuating lesion in left lobe of the thyroid gland measures 0.9 x 0.9 x 1.7 cm. Lungs/Pleura: Lungs clear.  No pleural or pericardial effusion. Upper Abdomen: Negative. Musculoskeletal: Negative. Review of the MIP images confirms the above findings. IMPRESSION: Negative for pulmonary embolus.  No acute abnormality. 1.7 cm lesion left lobe of the thyroid. Recommend thyroid US.(Ref: J Am Coll Radiol. 2015 Feb;12(2): 143-50). Electronically Signed   By: Inge Rise M.D.   On: 01/08/2020 13:25   DG Chest Portable 1 View  Result Date: 01/08/2020 CLINICAL DATA:  Chest pain.  Tachycardia.  Atrial fibrillation. EXAM: PORTABLE CHEST 1 VIEW COMPARISON:  None. FINDINGS: The heart size and mediastinal contours are within normal limits. Both lungs are clear. The visualized skeletal structures are unremarkable. IMPRESSION: No active disease. Electronically Signed   By: Van Clines M.D.   On: 01/08/2020 10:46    Assessment and Plan:   1. New onset atrial fibrillation with rapid ventricular rate Felt dizzy and lightheaded at work.  Spontaneously converted after IV Cardizem.  CHA2DS2-VASc score of 1 for female/ 2  if gestational diabetes.  Patient reports prior history of PAC/PVC lasting less than 1 minute.  Maintaining sinus rhythm.  TSH normal.  Will need echocardiogram.  2.  VSD -Patient reports spontaneous closure as a kid.  No surgery required. -Update echocardiogram  3.  Impaired glucose tolerance with history of gestational diabetes -Recent hemoglobin A1c 5.1. -She has made dietary changes. -Followed by endocrinologist  4. 1.7 cm lesion left lobe of the thyroid on CTA of chest - Recommend thyroid US  5. Elevated D-dimer - No PE on CTA of chest  For questions or updates, please contact [facility] Please consult [email or web address] for contact info under    Signed, Candee Furbish, MD  01/08/2020 2:18 PM    Personally seen and examined. Agree with above.   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  Monitor there showed A. fib with RVR.  Here in the emergency department heart rate was up to 180 bpm.  Felt symptomatic with this.  Has not felt this before.  Has had occasional PACs before.  No reported stimulants/supplements.  She does drink a couple coffee a day which was usual for her.  She and her husband did stay up until 11 PM watching a movie last night which for them is late given their newborn.  He does not notice any snoring or apneic spells.  Here, she was given IV diltiazem and within about 30 minutes she states she converted to normal sinus rhythm.  Immediately felt better.  She has been working with Dr. Cruzita Lederer since her gestational diabetes on her glucose intolerance.  Has changed to a more vegan type diet.  GEN: Well nourished, well developed, in no acute distress  HEENT: normal  Neck: no JVD, carotid bruits, or masses Cardiac: RRR; no murmurs, rubs, or gallops,no edema  Respiratory:  clear to auscultation bilaterally, normal work of breathing GI: soft, nontender, nondistended, + BS MS: no deformity or atrophy  Skin: warm and dry, no rash Neuro:  Alert and Oriented x 3, Strength and sensation are intact Psych: euthymic mood, full affect  CT scan of chest showed no evidence of PE.  She did have a thyroid nodule noted which it was recommended that she have a dedicated thyroid ultrasound performed.  Currently she is feeling well.  Assessment and plan:  New onset atrial  fibrillation paroxysmal at age 28 years old with prior VSD that spontaneously closed as a child  Atrial fibrillation paroxysmal -We will give her diltiazem as needed 30 mg to be taken if this recurs.  She knows to hydrate well. -We will set her up for an outpatient echocardiogram. -If atrial fibrillation were to return, given her symptomatic nature, we will have her set up for consultation with Dr. Rayann Heman for potential ablative therapy.  She was amenable to this. -CHA2DS2-VASc is 1 for female.  No anticoagulation warranted.  Thyroid nodule -Discussed this with her.  Recommended that she discuss this with Dr. Cruzita Lederer, her endocrinologist. -Radiology recommend dedicated thyroid ultrasound. -TSH normal.  History of prior VSD -Spontaneously closed as a child.  Checking echocardiogram.    Okay for discharge from the emergency department.  We will set up close follow-up.  Candee Furbish, MD

## 2020-01-09 ENCOUNTER — Other Ambulatory Visit: Payer: Self-pay | Admitting: Internal Medicine

## 2020-01-09 ENCOUNTER — Telehealth: Payer: Self-pay | Admitting: Internal Medicine

## 2020-01-09 DIAGNOSIS — E041 Nontoxic single thyroid nodule: Secondary | ICD-10-CM

## 2020-01-09 NOTE — Telephone Encounter (Signed)
Patient requests to be called at ph# [phone]  re: Patient was at the [facility] Emergency Room for a new Afib where a chest Xray was done and they found a thyroid nodule on the Xray. The Attending Physician suggested to patient to call Dr. Cruzita Lederer to let her know and that the thyroid nodule would need to be evaluated/ultrasound.

## 2020-01-09 NOTE — Telephone Encounter (Signed)
I ordered a thyroid ultrasound to be done at [facility] imaging downstairs.  They will call her to schedule this.

## 2020-01-10 NOTE — Telephone Encounter (Signed)
Called patient ultrasound was already scheduled and she had no more questions at this time.

## 2020-01-14 ENCOUNTER — Ambulatory Visit: Payer: Self-pay | Admitting: Registered Nurse

## 2020-01-16 ENCOUNTER — Other Ambulatory Visit: Payer: Self-pay

## 2020-01-16 ENCOUNTER — Ambulatory Visit
Admission: RE | Admit: 2020-01-16 | Discharge: 2020-01-16 | Disposition: A | Discharge status: Home / Self Care | Payer: 59 | Source: Ambulatory Visit | Attending: Internal Medicine | Admitting: Internal Medicine

## 2020-01-16 DIAGNOSIS — E041 Nontoxic single thyroid nodule: Secondary | ICD-10-CM

## 2020-01-17 ENCOUNTER — Encounter: Payer: Self-pay | Admitting: Internal Medicine

## 2020-01-20 ENCOUNTER — Other Ambulatory Visit: Payer: Self-pay | Admitting: Internal Medicine

## 2020-01-20 ENCOUNTER — Other Ambulatory Visit: Payer: Self-pay

## 2020-01-20 ENCOUNTER — Encounter: Payer: Self-pay | Admitting: Registered Nurse

## 2020-01-20 ENCOUNTER — Ambulatory Visit: Payer: 59 | Admitting: Registered Nurse

## 2020-01-20 VITALS — BP 116/77 | HR 112 | Temp 98.0°F | Resp 18 | Ht 63.0 in | Wt 125.8 lb

## 2020-01-20 DIAGNOSIS — Z1159 Encounter for screening for other viral diseases: Secondary | ICD-10-CM | POA: Diagnosis not present

## 2020-01-20 DIAGNOSIS — F419 Anxiety disorder, unspecified: Secondary | ICD-10-CM | POA: Diagnosis not present

## 2020-01-20 DIAGNOSIS — N390 Urinary tract infection, site not specified: Secondary | ICD-10-CM | POA: Diagnosis not present

## 2020-01-20 DIAGNOSIS — E041 Nontoxic single thyroid nodule: Secondary | ICD-10-CM

## 2020-01-20 LAB — POCT URINALYSIS DIP (MANUAL ENTRY)
Bilirubin, UA: NEGATIVE
Blood, UA: NEGATIVE
Glucose, UA: NEGATIVE mg/dL
Ketones, POC UA: NEGATIVE mg/dL
Leukocytes, UA: NEGATIVE
Nitrite, UA: NEGATIVE
Protein Ur, POC: NEGATIVE mg/dL
Spec Grav, UA: 1.02 (ref 1.010–1.025)
Urobilinogen, UA: 0.2 E.U./dL
pH, UA: 7 (ref 5.0–8.0)

## 2020-01-20 MED ORDER — SERTRALINE HCL 50 MG PO TABS: 50.0000 mg | ORAL_TABLET | Freq: Every day | ORAL | 0 refills | Status: DC

## 2020-01-20 NOTE — Patient Instructions (Signed)
° ° ° °  If you have lab work done today you will be contacted with your lab results within the next 2 weeks.  If you have not heard from us then please contact us. The fastest way to get your results is to register for My Chart. ° ° °IF you received an x-ray today, you will receive an invoice from [facility]. Please contact  Radiology at [phone] with questions or concerns regarding your invoice.  ° °IF you received labwork today, you will receive an invoice from [facility]. Please contact [facility] at [phone] with questions or concerns regarding your invoice.  ° °Our billing staff will not be able to assist you with questions regarding bills from these companies. ° °You will be contacted with the lab results as soon as they are available. The fastest way to get your results is to activate your My Chart account. Instructions are located on the last page of this paperwork. If you have not heard from us regarding the results in 2 weeks, please contact this office. °  ° ° ° °

## 2020-01-20 NOTE — Telephone Encounter (Signed)
Patient called stating she just had her thyroid ultrasound and she needs a Biopsy. Patient wondering if we could refer her somewhere for this? Patient ph# [phone]

## 2020-01-20 NOTE — Progress Notes (Signed)
Acute Office Visit  Subjective:    Patient ID: Jasmine Bell, female    DOB: 04-Sep-1985, 34 y.o.   MRN: [id number]  Chief Complaint  Patient presents with  . Urinary Tract Infection    Patient states she has been having some cloudy urine and some slight order for about 4 days.    HPI Patient is in today for dysuria. Cloudy urine and slight odor for around 4 days. No flank pain or systemic symptoms. Remote history of UTI - years ago. No recent abx use. No vaginal symptoms.  Otherwise, requesting refill on sertraline 50mg  PO qd. Had been on this for some time prior to her pregnancy, now has resumed with short course given by her OBGYN.   Hoping to reestablish with our clinic long term.  History notable for anxiety, gestational diabetes, one episode of afib that resolve with diltiazem push - still being worked up with cardiology, incidental finding of thyroid nodule - has biopsy scheduled.   Past Medical History:  Diagnosis Date  . Anxiety   . Gestational diabetes   . History of infertility   . Infertility, female   . Newborn product of IVF pregnancy   . SVD (spontaneous vaginal delivery)    x 1    Past Surgical History:  Procedure Laterality Date  . aerolar cyst on breast removed    . BREAST SURGERY     areolar cyst removal  . CESAREAN SECTION N/A 09/23/2019   Procedure: CESAREAN SECTION;  Surgeon: Paula Compton, MD;  Location: [facility];  Service: Obstetrics;  Laterality: N/A;  . DILATION AND CURETTAGE OF UTERUS    . DILATION AND EVACUATION  04/12/2016   Procedure: DILATATION AND EVACUATION;  Surgeon: Cheri Fowler, MD;  Location: [facility];  Service: Gynecology;;  . Brigitte Pulse AND EVACUATION N/A 05/17/2017   Procedure: DILATATION AND EVACUATION;  Surgeon: Paula Compton, MD;  Location: [facility];  Service: Gynecology;  Laterality: N/A;  . DILATION AND EVACUATION N/A 04/10/2018   Procedure: DILATATION AND EVACUATION;  Surgeon: Paula Compton, MD;  Location: [facility];   Service: Gynecology;  Laterality: N/A;  . WISDOM TOOTH EXTRACTION      Family History  Problem Relation Age of Onset  . Depression Father   . Mental illness Father   . Breast cancer Maternal Aunt   . Diabetes Paternal Aunt   . Testicular cancer Maternal Grandfather   . Bipolar disorder Paternal Grandfather   . Heart disease Paternal Grandfather   . Hyperlipidemia Paternal Grandfather   . Cancer Maternal Grandmother   . Stroke Maternal Grandmother   . Mental illness Maternal Grandmother     Social History   Socioeconomic History  . Marital status: Married    Spouse name: Not on file  . Number of children: 1  . Years of education: Not on file  . Highest education level: Master's degree (e.g., MA, MS, MEng, MEd, MSW, MBA)  Occupational History  . Occupation: Immunologist  Tobacco Use  . Smoking status: Never Smoker  . Smokeless tobacco: Never Used  Vaping Use  . Vaping Use: Never used  Substance and Sexual Activity  . Alcohol use: Yes    Comment: socially  . Drug use: No  . Sexual activity: Yes    Partners: Male    Birth control/protection: None    Comment: approx [redacted] wks gestation  Other Topics Concern  . Not on file  Social History Narrative   Pt is from [place]. Lives in [place]. Works  as Immunologist. Lives at home with husband and 5 yo son.    Social Determinants of Health   Financial Resource Strain:   . Difficulty of Paying Living Expenses:   Food Insecurity:   . Worried About Charity fundraiser in the Last Year:   . Arboriculturist in the Last Year:   Transportation Needs:   . Film/video editor (Medical):   [name] Lack of Transportation (Non-Medical):   Physical Activity:   . Days of Exercise per Week:   . Minutes of Exercise per Session:   Stress:   . Feeling of Stress :   Social Connections:   . Frequency of Communication with Friends and Family:   . Frequency of Social Gatherings with Friends and Family:   . Attends Religious Services:   . Active  Member of Clubs or Organizations:   . Attends Archivist Meetings:   [name] Marital Status:   Intimate Partner Violence:   . Fear of Current or Ex-Partner:   . Emotionally Abused:   [name] Physically Abused:   . Sexually Abused:     Outpatient Medications Prior to Visit  Medication Sig Dispense Refill  . diltiazem (CARDIZEM) 30 MG tablet Take 1 tablet (30 mg total) by mouth every 6 (six) hours as needed (for elevated heart rate). (Patient not taking: Reported on 01/20/2020) 30 tablet 3  . HEATHER 0.35 MG tablet Take 1 tablet by mouth daily. (Patient not taking: Reported on 01/20/2020)     No facility-administered medications prior to visit.    No Known Allergies  Review of Systems  Constitutional: Negative.   HENT: Negative.   Eyes: Negative.   Respiratory: Negative.   Cardiovascular: Negative.   Gastrointestinal: Negative.   Endocrine: Negative.   Genitourinary: Negative.   Musculoskeletal: Negative.   Skin: Negative.   Allergic/Immunologic: Negative.   Neurological: Negative.   Hematological: Negative.   Psychiatric/Behavioral: Negative.   All other systems reviewed and are negative.      Objective:    Physical Exam Constitutional:      Appearance: Normal appearance. She is normal weight.  Cardiovascular:     Rate and Rhythm: Normal rate and regular rhythm.  Pulmonary:     Effort: Pulmonary effort is normal. No respiratory distress.  Neurological:     General: No focal deficit present.     Mental Status: She is alert and oriented to person, place, and time. Mental status is at baseline.  Psychiatric:        Mood and Affect: Mood normal.        Behavior: Behavior normal.        Thought Content: Thought content normal.        Judgment: Judgment normal.     BP 116/77   Pulse (!) 112   Temp 98 F (36.7 C) (Temporal)   Resp 18   Ht 5\' 3"  (1.6 m)   Wt 125 lb 12.8 oz (57.1 kg)   SpO2 95%   BMI 22.28 kg/m  Wt Readings from Last 3 Encounters:  01/20/20 125  lb 12.8 oz (57.1 kg)  01/08/20 128 lb (58.1 kg)  12/06/19 132 lb (59.9 kg)    There are no preventive care reminders to display for this patient.  There are no preventive care reminders to display for this patient.   Lab Results  Component Value Date   TSH 2.066 01/08/2020   Lab Results  Component Value Date   WBC 6.9 01/08/2020   HGB  13.0 01/08/2020   HCT 41.8 01/08/2020   MCV 87.6 01/08/2020   PLT 213 01/08/2020   Lab Results  Component Value Date   NA 138 01/08/2020   K 3.5 01/08/2020   CO2 23 01/08/2020   GLUCOSE 93 01/08/2020   BUN 7 01/08/2020   CREATININE 0.70 01/08/2020   BILITOT 1.1 01/08/2020   ALKPHOS 50 01/08/2020   AST 21 01/08/2020   ALT 16 01/08/2020   PROT 6.5 01/08/2020   ALBUMIN 4.0 01/08/2020   CALCIUM 9.2 01/08/2020   ANIONGAP 12 01/08/2020   Lab Results  Component Value Date   CHOL 173 01/12/2018   Lab Results  Component Value Date   HDL 66 01/12/2018   Lab Results  Component Value Date   LDLCALC 95 01/12/2018   Lab Results  Component Value Date   TRIG 60 01/12/2018   Lab Results  Component Value Date   CHOLHDL 2.6 01/12/2018   Lab Results  Component Value Date   HGBA1C 5.1 12/06/2019       Assessment & Plan:   Problem List Items Addressed This Visit    None    Visit Diagnoses    Urinary tract infection without hematuria, site unspecified    -  Primary   Relevant Orders   POCT urinalysis dipstick (Completed)   Urine Culture   Need for hepatitis C screening test           No orders of the defined types were placed in this encounter.  PLAN  poct ua wnl  Will send culture  Encourage hydration and nonpharm  Refill sertraline, return in 6-12 weeks for med check  Patient encouraged to call clinic with any questions, comments, or concerns.  Maximiano Coss, NP

## 2020-01-22 ENCOUNTER — Encounter: Payer: Self-pay | Admitting: Registered Nurse

## 2020-01-22 LAB — URINE CULTURE

## 2020-01-23 ENCOUNTER — Other Ambulatory Visit (HOSPITAL_COMMUNITY)
Admission: RE | Admit: 2020-01-23 | Discharge: 2020-01-23 | Disposition: A | Discharge status: Home / Self Care | Payer: 59 | Source: Ambulatory Visit | Attending: Internal Medicine | Admitting: Internal Medicine

## 2020-01-23 ENCOUNTER — Ambulatory Visit
Admission: RE | Admit: 2020-01-23 | Discharge: 2020-01-23 | Disposition: A | Discharge status: Home / Self Care | Payer: 59 | Source: Ambulatory Visit | Attending: Internal Medicine | Admitting: Internal Medicine

## 2020-01-23 ENCOUNTER — Encounter: Payer: Self-pay | Admitting: Registered Nurse

## 2020-01-23 ENCOUNTER — Other Ambulatory Visit: Payer: Self-pay

## 2020-01-23 DIAGNOSIS — E041 Nontoxic single thyroid nodule: Secondary | ICD-10-CM | POA: Diagnosis not present

## 2020-01-23 DIAGNOSIS — N3000 Acute cystitis without hematuria: Secondary | ICD-10-CM

## 2020-01-23 MED ORDER — AMPICILLIN 500 MG PO CAPS: 500.0000 mg | ORAL_CAPSULE | Freq: Four times a day (QID) | ORAL | 0 refills | Status: DC

## 2020-01-23 NOTE — Procedures (Signed)
PROCEDURE SUMMARY:  Using direct ultrasound guidance, 5 passes were made using 25 g needles into the nodule within the left lobe of the thyroid.   Ultrasound was used to confirm needle placements on all occasions.   EBL = trace  Specimens were sent to Pathology for analysis.  See procedure note under Imaging tab in Epic for full procedure details.  Dallis Darden S Tyrann Donaho PA-C 01/23/2020 4:09 PM

## 2020-01-23 NOTE — Telephone Encounter (Signed)
Pt has a swollen lymph node in her Axilla and states it is tender she would like to have biopsy and [place] is this appropriate or should she make another appointment with you?

## 2020-01-23 NOTE — Telephone Encounter (Signed)
Pt responding to previous message for you

## 2020-01-24 LAB — CYTOLOGY - NON PAP

## 2020-01-27 ENCOUNTER — Ambulatory Visit (HOSPITAL_COMMUNITY): Payer: 59 | Attending: Internal Medicine

## 2020-01-27 ENCOUNTER — Other Ambulatory Visit: Payer: Self-pay

## 2020-01-27 DIAGNOSIS — I480 Paroxysmal atrial fibrillation: Secondary | ICD-10-CM | POA: Diagnosis present

## 2020-01-27 LAB — ECHOCARDIOGRAM COMPLETE
Area-P 1/2: 3.6 cm2
S' Lateral: 2.6 cm

## 2020-01-29 ENCOUNTER — Other Ambulatory Visit (HOSPITAL_COMMUNITY): Payer: 59

## 2020-02-03 ENCOUNTER — Telehealth: Payer: Self-pay | Admitting: Internal Medicine

## 2020-02-03 NOTE — Telephone Encounter (Signed)
Pt returned call. Informed about Dr. Quin Hoop response below. Verbalized acceptance and understanding.

## 2020-02-03 NOTE — Telephone Encounter (Signed)
Patient requests to be called at ph# [phone] (if unable to answer patient requests a detailed message be left on her voice mail) re: status of biopsy results that were sent out.

## 2020-02-03 NOTE — Telephone Encounter (Signed)
Called Jasmine Bell to inform her about Dr. Quin Hoop response below. LVM requesting returned call.

## 2020-02-03 NOTE — Telephone Encounter (Signed)
Please review pt request and advise. Will be happy to return her call

## 2020-02-11 NOTE — Progress Notes (Signed)
Cardiology Office Note    Date:  02/12/2020   ID:  Jasmine Bell, DOB [birth year], MRN [id number]  PCP:  Maximiano Coss, NP  Cardiologist: Candee Furbish, MD EPS: None  Chief Complaint  Patient presents with  . Hospitalization Follow-up    History of Present Illness:  Jasmine Bell is a 34 y.o. 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 fib with RVR.  She is spontaneous late converted to normal sinus rhythm with IV diltiazem.  CHA2DS2-VASc score is 1 for female so no anticoagulation warranted.  As needed diltiazem given.  Outpatient echo ordered.  Is added she stood up suddenly became dizzy and has a history of occasional PVCs and PACs.  CT of chest negative for PE but a 1.7 cm lesion on the left lobe of the thyroid recommend thyroid ultrasound.  TSH was normal.  2D echo 01/27/2020 normal LV function no evidence of VSD normal in size..  Patient comes in for f/u. Had a thyroid biopsy that was inconclusive and sent to another lab. Has cut back on caffeine and drinking more water to stay hydrated. Drinks a glass of wine daily. Walks 4 miles during work day but no regular exercise outside of work.    Past Medical History:  Diagnosis Date  . Anxiety   . Gestational diabetes   . History of infertility   . Infertility, female   . Newborn product of IVF pregnancy   . SVD (spontaneous vaginal delivery)    x 1    Past Surgical History:  Procedure Laterality Date  . aerolar cyst on breast removed    . BREAST SURGERY     areolar cyst removal  . CESAREAN SECTION N/A 09/23/2019   Procedure: CESAREAN SECTION;  Surgeon: Paula Compton, MD;  Location: [facility];  Service: Obstetrics;  Laterality: N/A;  . DILATION AND CURETTAGE OF UTERUS    . DILATION AND EVACUATION  04/12/2016   Procedure: DILATATION AND EVACUATION;  Surgeon: Cheri Fowler, MD;   Location: [facility];  Service: Gynecology;;  . Brigitte Pulse AND EVACUATION N/A 05/17/2017   Procedure: DILATATION AND EVACUATION;  Surgeon: Paula Compton, MD;  Location: [facility];  Service: Gynecology;  Laterality: N/A;  . DILATION AND EVACUATION N/A 04/10/2018   Procedure: DILATATION AND EVACUATION;  Surgeon: Paula Compton, MD;  Location: [facility];  Service: Gynecology;  Laterality: N/A;  . WISDOM TOOTH EXTRACTION      Current Medications: Current Meds  Medication Sig  . diltiazem (CARDIZEM) 30 MG tablet Take 1 tablet (30 mg total) by mouth every 6 (six) hours as needed (for elevated heart rate).  [name] HEATHER 0.35 MG tablet Take 1 tablet by mouth daily.   . sertraline (ZOLOFT) 50 MG tablet Take 1 tablet (50 mg total) by mouth daily.     Allergies:   Patient has no known allergies.   Social History   Socioeconomic History  . Marital status: Married    Spouse name: Not on file  . Number of children: 1  . Years of education: Not on file  . Highest education level: Master's degree (e.g., MA, MS, MEng, MEd, MSW, MBA)  Occupational History  . Occupation: Immunologist  Tobacco Use  . Smoking status: Never Smoker  . Smokeless tobacco: Never Used  Vaping Use  . Vaping Use: Never used  Substance and Sexual Activity  . Alcohol use: Yes  Comment: socially  . Drug use: No  . Sexual activity: Yes    Partners: Male    Birth control/protection: None    Comment: approx [redacted] wks gestation  Other Topics Concern  . Not on file  Social History Narrative   Pt is from [place]. Lives in [place]. Works as Immunologist. Lives at home with husband and 46 yo son.    Social Determinants of Health   Financial Resource Strain:   . Difficulty of Paying Living Expenses:   Food Insecurity:   . Worried About Charity fundraiser in the Last Year:   . Arboriculturist in the Last Year:   Transportation Needs:   . Film/video editor (Medical):   [name] Lack of Transportation (Non-Medical):   Physical Activity:   .  Days of Exercise per Week:   . Minutes of Exercise per Session:   Stress:   . Feeling of Stress :   Social Connections:   . Frequency of Communication with Friends and Family:   . Frequency of Social Gatherings with Friends and Family:   . Attends Religious Services:   . Active Member of Clubs or Organizations:   . Attends Archivist Meetings:   [name] Marital Status:      Family History:  The patient's   family history includes Bipolar disorder in her paternal grandfather; Breast cancer in her maternal aunt; Cancer in her maternal grandmother; Depression in her father; Diabetes in her paternal aunt; Heart disease in her paternal grandfather; Hyperlipidemia in her paternal grandfather; Mental illness in her father and maternal grandmother; Stroke in her maternal grandmother; Testicular cancer in her maternal grandfather.   ROS:   Please see the history of present illness.    ROS All other systems reviewed and are negative.   PHYSICAL EXAM:   VS:  BP (!) 92/54   Pulse 83   Ht 5\' 3"  (1.6 m)   Wt 123 lb 12.8 oz (56.2 kg)   SpO2 97%   BMI 21.93 kg/m   Physical Exam  GEN: Thin, in no acute distress  Neck: no JVD, carotid bruits, or masses Cardiac:RRR; no murmurs, rubs, or gallops  Respiratory:  clear to auscultation bilaterally, normal work of breathing GI: soft, nontender, nondistended, + BS Ext: without cyanosis, clubbing, or edema, Good distal pulses bilaterally Neuro:  Alert and Oriented x 3 Psych: euthymic mood, full affect  Wt Readings from Last 3 Encounters:  02/12/20 123 lb 12.8 oz (56.2 kg)  01/20/20 125 lb 12.8 oz (57.1 kg)  01/08/20 128 lb (58.1 kg)      Studies/Labs Reviewed:   EKG:  EKG is not ordered today.   Recent Labs: 01/08/2020: ALT 16; BUN 7; Creatinine, Ser 0.70; Hemoglobin 13.0; Magnesium 1.6; Platelets 213; Potassium 3.5; Sodium 138; TSH 2.066   Lipid Panel    Component Value Date/Time   CHOL 173 01/12/2018 1003   TRIG 60 01/12/2018 1003    HDL 66 01/12/2018 1003   CHOLHDL 2.6 01/12/2018 1003   CHOLHDL 2.5 09/26/2011 0900   VLDL 17 09/26/2011 0900   LDLCALC 95 01/12/2018 1003    Additional studies/ records that were reviewed today include:  2D echo 01/27/2020 IMPRESSIONS     1. Left ventricular ejection fraction, by estimation, is 70 to 75%. The  left ventricle has hyperdynamic function. The left ventricle has no  regional wall motion abnormalities. Left ventricular diastolic parameters  were normal.   2. Right ventricular systolic function is normal. The  right ventricular  size is normal. There is normal pulmonary artery systolic pressure.   3. The mitral valve is normal in structure. Trivial mitral valve  regurgitation.   4. The aortic valve is normal in structure. Aortic valve regurgitation is  not visualized.   5. The inferior vena cava is dilated in size with <50% respiratory  variability, suggesting right atrial pressure of 15 mmHg.  CT Angio Chest PE W/Cm &/Or Wo Cm   Result Date: 01/08/2020 CLINICAL DATA:  Onset tachycardia and atrial fibrillation today. EXAM: CT ANGIOGRAPHY CHEST WITH CONTRAST TECHNIQUE: Multidetector CT imaging of the chest was performed using the standard protocol during bolus administration of intravenous contrast. Multiplanar CT image reconstructions and MIPs were obtained to evaluate the vascular anatomy. CONTRAST:  70 mL OMNIPAQUE IOHEXOL 350 MG/ML SOLN COMPARISON:  Single-view of the chest earlier today. FINDINGS: Cardiovascular: Satisfactory opacification of the pulmonary arteries to the segmental level. No evidence of pulmonary embolism. Normal heart size. No pericardial effusion. Mediastinum/Nodes: No lymphadenopathy. The esophagus appears normal. Low attenuating lesion in left lobe of the thyroid gland measures 0.9 x 0.9 x 1.7 cm. Lungs/Pleura: Lungs clear.  No pleural or pericardial effusion. Upper Abdomen: Negative. Musculoskeletal: Negative. Review of the MIP images confirms the above  findings. IMPRESSION: Negative for pulmonary embolus.  No acute abnormality. 1.7 cm lesion left lobe of the thyroid. Recommend thyroid US.(Ref: J Am Coll Radiol. 2015 Feb;12(2): 143-50). Electronically Signed   By: Inge Rise M.D.   On: 01/08/2020 13:25    DG Chest Portable 1 View   Result Date: 01/08/2020 CLINICAL DATA:  Chest pain.  Tachycardia.  Atrial fibrillation. EXAM: PORTABLE CHEST 1 VIEW COMPARISON:  None. FINDINGS: The heart size and mediastinal contours are within normal limits. Both lungs are clear. The visualized skeletal structures are unremarkable. IMPRESSION: No active disease. Electronically Signed   By: Van Clines M.D.   On: 01/08/2020 10:46          ASSESSMENT:    1. Paroxysmal atrial fibrillation (HCC)   2. VSD (ventricular septal defect)   3. Thyroid nodule      PLAN:  In order of problems listed above:  PAF converted to normal sinus rhythm with IV diltiazem.  CHA2DS2-VASc equals 1 for female no anticoagulation warranted.  Has a prescription for as needed diltiazem and asked to stay hydrated.  If recurrence consider consult with Dr. Rayann Heman for potential ablation. No further episodes of Afib. Staying hydrated. Decrease alcohol intake  History of VSD repair-no VSD on recent echo-reviewed echo with patient  Thyroid nodule biopsy inconclusive and sent to another lab, TSH normal followed by Dr. Cruzita Lederer    Medication Adjustments/Labs and Tests Ordered: Current medicines are reviewed at length with the patient today.  Concerns regarding medicines are outlined above.  Medication changes, Labs and Tests ordered today are listed in the Patient Instructions below. Patient Instructions  Medication Instructions:  Your physician recommends that you continue on your current medications as directed. Please refer to the Current Medication list given to you today.  *If you need a refill on your cardiac medications before your next appointment, please call your  pharmacy*   Lab Work: None If you have labs (blood work) drawn today and your tests are completely normal, you will receive your results only by: [name] MyChart Message (if you have MyChart) OR . A paper copy in the mail If you have any lab test that is abnormal or we need to change your treatment, we will call  you to review the results.   Testing/Procedures: None   Follow-Up: At [facility], you and your health needs are our priority.  As part of our continuing mission to provide you with exceptional heart care, we have created designated Provider Care Teams.  These Care Teams include your primary Cardiologist (physician) and Advanced Practice Providers (APPs -  Physician Assistants and Nurse Practitioners) who all work together to provide you with the care you need, when you need it.  We recommend signing up for the patient portal called "MyChart".  Sign up information is provided on this After Visit Summary.  MyChart is used to connect with patients for Virtual Visits (Telemedicine).  Patients are able to view lab/test results, encounter notes, upcoming appointments, etc.  Non-urgent messages can be sent to your provider as well.   To learn more about what you can do with MyChart, go to [email or web address].    Your next appointment:   6 month(s)  The format for your next appointment:   In Person  Provider:   You may see Candee Furbish, MD or one of the following Advanced Practice Providers on your designated Care Team:    Truitt Merle, NP  Cecilie Kicks, NP  Kathyrn Drown, NP    Other Instructions None     Signed, Ermalinda Barrios, PA-C  02/12/2020 9:50 AM    [facility] [address], [place], [place]  [address] Phone: [phone]; Fax: [phone]

## 2020-02-12 ENCOUNTER — Other Ambulatory Visit: Payer: Self-pay

## 2020-02-12 ENCOUNTER — Encounter: Payer: Self-pay | Admitting: Physician Assistant

## 2020-02-12 ENCOUNTER — Ambulatory Visit: Payer: 59 | Admitting: Physician Assistant

## 2020-02-12 VITALS — BP 92/54 | HR 83 | Ht 63.0 in | Wt 123.8 lb

## 2020-02-12 DIAGNOSIS — E041 Nontoxic single thyroid nodule: Secondary | ICD-10-CM | POA: Diagnosis not present

## 2020-02-12 DIAGNOSIS — Q210 Ventricular septal defect: Secondary | ICD-10-CM | POA: Diagnosis not present

## 2020-02-12 DIAGNOSIS — I480 Paroxysmal atrial fibrillation: Secondary | ICD-10-CM

## 2020-02-12 NOTE — Patient Instructions (Signed)
Medication Instructions:  Your physician recommends that you continue on your current medications as directed. Please refer to the Current Medication list given to you today.  *If you need a refill on your cardiac medications before your next appointment, please call your pharmacy*   Lab Work: None If you have labs (blood work) drawn today and your tests are completely normal, you will receive your results only by:  [place] (if you have MyChart) OR  A paper copy in the mail If you have any lab test that is abnormal or we need to change your treatment, we will call you to review the results.   Testing/Procedures: None   Follow-Up: At [facility], you and your health needs are our priority.  As part of our continuing mission to provide you with exceptional heart care, we have created designated Provider Care Teams.  These Care Teams include your primary Cardiologist (physician) and Advanced Practice Providers (APPs -  Physician Assistants and Nurse Practitioners) who all work together to provide you with the care you need, when you need it.  We recommend signing up for the patient portal called "MyChart".  Sign up information is provided on this After Visit Summary.  MyChart is used to connect with patients for Virtual Visits (Telemedicine).  Patients are able to view lab/test results, encounter notes, upcoming appointments, etc.  Non-urgent messages can be sent to your provider as well.   To learn more about what you can do with MyChart, go to [email or web address].    Your next appointment:   6 month(s)  The format for your next appointment:   In Person  Provider:   You may see Candee Furbish, MD or one of the following Advanced Practice Providers on your designated Care Team:    Truitt Merle, NP  Cecilie Kicks, NP  Kathyrn Drown, NP    Other Instructions None

## 2020-02-17 ENCOUNTER — Telehealth: Payer: Self-pay | Admitting: Internal Medicine

## 2020-02-17 NOTE — Telephone Encounter (Signed)
Patient requests to be called at ph# [phone] (if patient does not answer-patient gives permission to leave a detailed message on voice mail re: the following:) to be given biopsy results that had been sent off for further analysis.

## 2020-02-17 NOTE — Telephone Encounter (Signed)
Routing to Dr. Cruzita Lederer, please review.

## 2020-02-18 ENCOUNTER — Encounter: Payer: Self-pay | Admitting: Internal Medicine

## 2020-02-19 NOTE — Telephone Encounter (Signed)
M, I still did not receive her results for Afirma...  Can you check with the cytology department tomorrow just to make sure there is no pb with the lab. Ty, C

## 2020-02-24 NOTE — Telephone Encounter (Signed)
Patient called to check status of her Biopsy results.

## 2020-03-05 ENCOUNTER — Ambulatory Visit: Payer: 59 | Admitting: Internal Medicine

## 2020-03-05 ENCOUNTER — Encounter: Payer: Self-pay | Admitting: Internal Medicine

## 2020-03-05 ENCOUNTER — Other Ambulatory Visit: Payer: Self-pay

## 2020-03-05 VITALS — BP 120/60 | HR 80 | Ht 63.0 in | Wt 123.0 lb

## 2020-03-05 DIAGNOSIS — R7302 Impaired glucose tolerance (oral): Secondary | ICD-10-CM | POA: Diagnosis not present

## 2020-03-05 DIAGNOSIS — E041 Nontoxic single thyroid nodule: Secondary | ICD-10-CM | POA: Diagnosis not present

## 2020-03-05 LAB — POCT GLYCOSYLATED HEMOGLOBIN (HGB A1C): Hemoglobin A1C: 5.1 % (ref 4.0–5.6)

## 2020-03-05 NOTE — Patient Instructions (Addendum)
I will refer you to Dr. Harlow Asa.  We need thyroid labs ~5 weeks after the Surgery.  Please come back for a follow-up appointment in 3 months.

## 2020-03-05 NOTE — Progress Notes (Signed)
Patient ID: Jasmine Bell, female   DOB: September 30, 1985, 34 y.o.   MRN: [id number]   This visit occurred during the SARS-CoV-2 public health emergency.  Safety protocols were in place, including screening questions prior to the visit, additional usage of staff PPE, and extensive cleaning of exam room while observing appropriate contact time as indicated for disinfecting solutions.   HPI: Jasmine Bell is a 34 y.o.-year-old female, initially referred by Maximiano Coss, NP, returning for follow-up for history of GDM, now postpartum, with impaired glucose tolerance.  Also, since last visit, she was diagnosed with a left thyroid nodule.  Last visit 3 months ago.  Left thyroid nodule: -Incidentally found during investigation for tachycardia: 01/08/2020: Chest CT angio: Low attenuating lesion in left lobe of the thyroid gland measures 0.9 x 0.9 x 1.7 cm.  At that time, patient not in touch with me, and I ordered a thyroid ultrasound: 01/16/2020: Thyroid U/S: Parenchymal Echotexture: Mildly heterogenous Isthmus: 0.3 cm Right lobe: 5.5 x 1.4 x 1.4 cm Left lobe: 5 x 1.4 x 1.6 cm _________________________________________________________  Estimated total number of nodules >/= 1 cm: 1 _________________________________________________________  Nodule # 2: Location: Left; Mid Maximum size: 1.8 cm; Other 2 dimensions: 1.1 x 1 cm Composition: solid/almost completely solid (2) Echogenicity: hypoechoic (2) ACR TI-RADS recommendations: **Given size (>/= 1.5 cm) and appearance, fine needle aspiration of this moderately suspicious nodule should be considered based on TI-RADS criteria. _________________________________________________________  There is a small 0.7 cm spongiform thyroid nodule in the isthmus that does not meet criteria for FNA or follow-up.  IMPRESSION: 1. Mildly heterogeneous, mildly enlarged thyroid gland. 2. There is a 1.8 cm TR4 thyroid nodule in the left mid thyroid gland.  Fine-needle aspiration is recommended for this thyroid nodule.      We biopsied the nodule, with inconclusive results:  FNA (01/23/2020):   - Atypia of undetermined significance (Bethesda category III)  SPECIMEN ADEQUACY:  Satisfactory for evaluation    Afirma: Suspicious  Pt denies: - feeling nodules in neck - hoarseness - dysphagia - choking - SOB with lying down  No family history of thyroid disease.  No FH of thyroid cancer. No h/o radiation tx to head or neck.  No seaweed or kelp. No recent contrast studies. No herbal supplements. No Biotin use. No recent steroids use.   Lab Results  Component Value Date   TSH 2.066 01/08/2020   TSH 1.550 01/12/2018   Impaired glucose tolerance:  Since last visit, she switched to a more plant-based diet.  She had eggs and yoghurt only few times, and mostly eats fruit, vegetable, seeds, legumes.  She lost 9 pounds since last visit.  Reviewed history: Pt has a history of infertility and had IVF last year.  She gave birth on 09/23/2019 (C-section) to an 8 pound 10 ounce healthy son.  She is on OCPs.  During the pregnancy, she had gestational diabetes with 2 abnormal OGTT tests.  Results reviewed for OB/GYN records  07/09/2019: 50 g glucose solution: Glucose 174 (65-1 39)  07/17/2019: 100 g glucose solution: Fasting: Glucose 70 (65-94) 1-hour: Glucose 145 (65-179) 2-hour: Glucose 138 (65-154) 3-hour: Glucose 148 (55-139)  08/20/2019: 100 g glucose solution: Fasting: Glucose 80 (65-94) 1-hour: Glucose 213 (65-179) 2-hour: Glucose 185 (65-154) 3-hour: Glucose 115 (55-139)  6 weeks postpartum:  - abnormal (abnormal) - 150 (55-139)  HbA1c at last visit was in the normal range: Lab Results  Component Value Date   HGBA1C 5.1 12/06/2019   She is not  on any medications for her hyperglycemia.  She is not checking sugars.  She denies increased urination, thirst, hunger, also denies blurry vision and unexpected weight loss.  She  jogs 5 times a week.  She is not breast-feeding since last visit.  -No CKD, last BUN/creatinine:  Lab Results  Component Value Date   BUN 7 01/08/2020   BUN 9 01/12/2018   CREATININE 0.70 01/08/2020   CREATININE 0.58 01/12/2018   -No HL; last set of lipids: Lab Results  Component Value Date   CHOL 173 01/12/2018   HDL 66 01/12/2018   LDLCALC 95 01/12/2018   TRIG 60 01/12/2018   CHOLHDL 2.6 01/12/2018   - last eye exam was "years ago": Normal.  - no  numbness and tingling in her feet.  Pt has FH of DM in P uncle, PGM. Father and PGF with prediabetes.  She also has another almost 35 years old child.  She has a history of 3 miscarriages/retained placental parts in 2017, 18, 19.  She is a Immunologist in anesthesia.  ROS: Constitutional: no weight gain/+ weight loss, no fatigue, no subjective hyperthermia, no subjective hypothermia Eyes: no blurry vision, no xerophthalmia ENT: no sore throat, + see HPI Cardiovascular: no CP/no SOB/no palpitations/no leg swelling Respiratory: no cough/no SOB/no wheezing Gastrointestinal: no N/no V/no D/no C/no acid reflux Musculoskeletal: no muscle aches/no joint aches Skin: no rashes, no hair loss Neurological: no tremors/no numbness/no tingling/no dizziness  I reviewed pt's medications, allergies, PMH, social hx, family hx, and changes were documented in the history of present illness. Otherwise, unchanged from my initial visit note.  Past Medical History:  Diagnosis Date  . Anxiety   . Gestational diabetes   . History of infertility   . Infertility, female   . Newborn product of IVF pregnancy   . SVD (spontaneous vaginal delivery)    x 1   Past Surgical History:  Procedure Laterality Date  . aerolar cyst on breast removed    . BREAST SURGERY     areolar cyst removal  . CESAREAN SECTION N/A 09/23/2019   Procedure: CESAREAN SECTION;  Surgeon: Paula Compton, MD;  Location: [facility];  Service: Obstetrics;  Laterality: N/A;  .  DILATION AND CURETTAGE OF UTERUS    . DILATION AND EVACUATION  04/12/2016   Procedure: DILATATION AND EVACUATION;  Surgeon: Cheri Fowler, MD;  Location: [facility];  Service: Gynecology;;  . Brigitte Pulse AND EVACUATION N/A 05/17/2017   Procedure: DILATATION AND EVACUATION;  Surgeon: Paula Compton, MD;  Location: [facility];  Service: Gynecology;  Laterality: N/A;  . DILATION AND EVACUATION N/A 04/10/2018   Procedure: DILATATION AND EVACUATION;  Surgeon: Paula Compton, MD;  Location: [facility];  Service: Gynecology;  Laterality: N/A;  . WISDOM TOOTH EXTRACTION     Social History   Socioeconomic History  . Marital status: Married    Spouse name: Not on file  . Number of children: 1  . Years of education: Not on file  . Highest education level: Master's degree (e.g., MA, MS, MEng, MEd, MSW, MBA)  Occupational History  . Occupation: Immunologist  Tobacco Use  . Smoking status: Never Smoker  . Smokeless tobacco: Never Used  Vaping Use  . Vaping Use: Never used  Substance and Sexual Activity  . Alcohol use: Yes    Comment: socially  . Drug use: No  . Sexual activity: Yes    Partners: Male    Birth control/protection: None    Comment: approx [redacted] wks gestation  Other Topics Concern  . Not on file  Social History Narrative   Pt is from [place]. Lives in [place]. Works as Immunologist. Lives at home with husband and 52 yo son.    Social Determinants of Health   Financial Resource Strain:   . Difficulty of Paying Living Expenses: Not on file  Food Insecurity:   . Worried About Charity fundraiser in the Last Year: Not on file  . Ran Out of Food in the Last Year: Not on file  Transportation Needs:   . Lack of Transportation (Medical): Not on file  . Lack of Transportation (Non-Medical): Not on file  Physical Activity:   . Days of Exercise per Week: Not on file  . Minutes of Exercise per Session: Not on file  Stress:   . Feeling of Stress : Not on file  Social Connections:   . Frequency of  Communication with Friends and Family: Not on file  . Frequency of Social Gatherings with Friends and Family: Not on file  . Attends Religious Services: Not on file  . Active Member of Clubs or Organizations: Not on file  . Attends Archivist Meetings: Not on file  . Marital Status: Not on file  Intimate Partner Violence:   . Fear of Current or Ex-Partner: Not on file  . Emotionally Abused: Not on file  . Physically Abused: Not on file  . Sexually Abused: Not on file   Current Outpatient Medications on File Prior to Visit  Medication Sig Dispense Refill  . diltiazem (CARDIZEM) 30 MG tablet Take 1 tablet (30 mg total) by mouth every 6 (six) hours as needed (for elevated heart rate). 30 tablet 3  . HEATHER 0.35 MG tablet Take 1 tablet by mouth daily.     . sertraline (ZOLOFT) 50 MG tablet Take 1 tablet (50 mg total) by mouth daily. 90 tablet 0   No current facility-administered medications on file prior to visit.   No Known Allergies Family History  Problem Relation Age of Onset  . Depression Father   . Mental illness Father   . Breast cancer Maternal Aunt   . Diabetes Paternal Aunt   . Testicular cancer Maternal Grandfather   . Bipolar disorder Paternal Grandfather   . Heart disease Paternal Grandfather   . Hyperlipidemia Paternal Grandfather   . Cancer Maternal Grandmother   . Stroke Maternal Grandmother   . Mental illness Maternal Grandmother    PE: BP 120/60   Pulse 80   Ht 5\' 3"  (1.6 m)   Wt 123 lb (55.8 kg)   SpO2 98%   BMI 21.79 kg/m  Wt Readings from Last 3 Encounters:  03/05/20 123 lb (55.8 kg)  02/12/20 123 lb 12.8 oz (56.2 kg)  01/20/20 125 lb 12.8 oz (57.1 kg)   Constitutional: normal weight, in NAD Eyes: PERRLA, EOMI, no exophthalmos ENT: moist mucous membranes, no thyromegaly, no cervical lymphadenopathy Cardiovascular: RRR, No MRG Respiratory: CTA B Gastrointestinal: abdomen soft, NT, ND, BS+ Musculoskeletal: no deformities, strength intact  in all 4 Skin: moist, warm, no rashes Neurological: no tremor with outstretched hands, DTR normal in all 4  ASSESSMENT: 1.  Left thyroid nodule   2.  Impaired glucose tolerance - History of gestational diabetes  PLAN:  1.  Left thyroid nodule -Patient without a thyroid history, but with a recently found left thyroid nodule observed on a CT angio performed for tachycardia/atrial fibrillation.  She contacted me after the results of the CT scan came  back and I ordered a thyroid ultrasound at that point.  This showed a heterogeneous thyroid with a mildly suspicious left thyroid nodule, measuring 1.8 cm in the largest dimension.  We biopsied the nodule and the results were inconclusive.  The sample was sent for molecular analysis and the Afirma test results returned yesterday, as suspicious. We scheduled this appointment urgently to discuss about the results. -At today's visit, we reviewed together the above results and the images of her recent thyroid ultrasound.  The nodule is not very large and only mildly suspicious.  It does not have internal blood flow and it is not hypoechoic. -I explained that A suspicious Afirma result means a 50% risk of cancer.  Unfortunately, we cannot have enough information about deciding whether this is a cancerous nodule or not until we analyze the entire left lobe.  Therefore, I did suggest a referral to surgery for lobectomy for now.  If the final result is thyroid cancer, she may not need total thyroidectomy.  She completely agrees with the plan. -We did discuss at this visit about thyroid cancer about usually being indolent, with a fairly benign course, less likely to affect her quality of life and life expectancy. -The risk of becoming hypothyroid after hemithyroidectomy is up to 25% so we need to check her TFTs after surgery and periodically afterwards -We discussed about how she needs to take levothyroxine in case we need to start. -I would like to see her back in  3 months but I advised her to come for thyroid labs approximately 5 weeks after the surgery she is  1.  Patient with history of gestational diabetes during her recent pregnancy with abnormal OGTT x3 during the last pregnancy and one abnormal OGTT obtained 1.5 months after giving birth. -At last visit we checked an HbA1c which returned at 5.1%, excellent.  I explained that this is not in the range for prediabetes and we can manage her without medications. -We did discuss about the concept of insulin resistance and I also made specific suggestions about how to reduce this to prevent evolution of her impaired glucose tolerance towards prediabetes and diabetes.  I suggested a more whole food plant-based diet.  I also offered to refer her to nutrition but she wanted to 1st research the materials that I gave her.  We also discussed about the importance of exercise and how this helps decrease insulin resistance. - she changed her diet since last >> more plant-based -she likes the diet and would like to continue - she lost 9 lbs since then -At today's visit, we checked another HbA1c: 5.1%  -Discussed about the J curve for HbA1c and the fact that this is actually an excellent value.  - Total time spent for the visit: 40 min, in obtaining medical information from the chart, reviewing her  previous labs, imaging evaluations, and treatments, reviewing her symptoms, counseling her about her conditions (please see the discussed topics above), and developing a plan to further investigate and treat them; she had a number of questions which I addressed.  Philemon Kingdom, MD PhD [facility]

## 2020-03-05 NOTE — Addendum Note (Signed)
Addended by: Cardell Peach I on: 03/05/2020 04:15 PM   Modules accepted: Orders

## 2020-03-10 ENCOUNTER — Encounter (HOSPITAL_COMMUNITY): Payer: Self-pay

## 2020-03-20 ENCOUNTER — Other Ambulatory Visit: Payer: Self-pay

## 2020-03-20 ENCOUNTER — Encounter: Payer: Self-pay | Admitting: Registered Nurse

## 2020-03-20 DIAGNOSIS — F419 Anxiety disorder, unspecified: Secondary | ICD-10-CM

## 2020-03-20 MED ORDER — SERTRALINE HCL 50 MG PO TABS: 50.0000 mg | ORAL_TABLET | Freq: Every day | ORAL | 3 refills | Status: DC

## 2020-03-20 NOTE — Telephone Encounter (Signed)
Ok to refill 90 tabs w 3 refills  Thanks,  Kathrin Ruddy, NP

## 2020-03-23 ENCOUNTER — Ambulatory Visit (HOSPITAL_COMMUNITY)
Admission: RE | Admit: 2020-03-23 | Discharge: 2020-03-23 | Disposition: A | Discharge status: Home / Self Care | Payer: 59 | Source: Ambulatory Visit | Attending: Physician Assistant | Admitting: Physician Assistant

## 2020-03-23 ENCOUNTER — Encounter (HOSPITAL_COMMUNITY): Payer: Self-pay | Admitting: Physician Assistant

## 2020-03-23 ENCOUNTER — Other Ambulatory Visit: Payer: Self-pay

## 2020-03-23 VITALS — BP 92/68 | HR 85 | Ht 63.0 in | Wt 122.6 lb

## 2020-03-23 DIAGNOSIS — F419 Anxiety disorder, unspecified: Secondary | ICD-10-CM | POA: Insufficient documentation

## 2020-03-23 DIAGNOSIS — Z79899 Other long term (current) drug therapy: Secondary | ICD-10-CM | POA: Diagnosis not present

## 2020-03-23 DIAGNOSIS — I480 Paroxysmal atrial fibrillation: Secondary | ICD-10-CM | POA: Insufficient documentation

## 2020-03-23 DIAGNOSIS — E041 Nontoxic single thyroid nodule: Secondary | ICD-10-CM | POA: Insufficient documentation

## 2020-03-23 NOTE — Progress Notes (Signed)
Primary Care Physician: Maximiano Coss, NP Primary Cardiologist: Dr Marlou Porch Primary Electrophysiologist: none Referring Physician: Dr Shon Baton is a 34 y.o. 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 The patient was initially diagnosed with atrial fibrillation 01/08/20 after presenting with symptoms of lightheadedness and palpitations. ECG showed afib with RVR. She was given IV diltiazem which converted her to SR. Patient is not on anticoagulation with a CHADS2VASC score of 1. She did have one additional episode of afib at work which converted after taking PRN diltiazem and doing vagal maneuvers in about 10 minutes. She denies significant snoring but does drink a glass of wine daily.   Today, she denies symptoms of chest pain, shortness of breath, orthopnea, PND, lower extremity edema, dizziness, presyncope, syncope, snoring, daytime somnolence, bleeding, or neurologic sequela. The patient is tolerating medications without difficulties and is otherwise without complaint today.    Atrial Fibrillation Risk Factors:  she does not have symptoms or diagnosis of sleep apnea. she does not have a history of rheumatic fever. she does have a history of alcohol use. The patient does not have a history of early familial atrial fibrillation or other arrhythmias.  she has a BMI of Body mass index is 21.72 kg/m.[name] Filed Weights   03/23/20 1523  Weight: 55.6 kg    Family History  Problem Relation Age of Onset  . Depression Father   . Mental illness Father   . Breast cancer Maternal Aunt   . Diabetes Paternal Aunt   . Testicular cancer Maternal Grandfather   . Bipolar disorder Paternal Grandfather   . Heart disease Paternal Grandfather   . Hyperlipidemia Paternal Grandfather   . Cancer Maternal Grandmother   . Stroke Maternal Grandmother   .  Mental illness Maternal Grandmother      Atrial Fibrillation Management history:  Previous antiarrhythmic drugs: none Previous cardioversions: none Previous ablations: none CHADS2VASC score: 1 Anticoagulation history: none   Past Medical History:  Diagnosis Date  . Anxiety   . Gestational diabetes   . History of infertility   . Infertility, female   . Newborn product of IVF pregnancy   . SVD (spontaneous vaginal delivery)    x 1   Past Surgical History:  Procedure Laterality Date  . aerolar cyst on breast removed    . BREAST SURGERY     areolar cyst removal  . CESAREAN SECTION N/A 09/23/2019   Procedure: CESAREAN SECTION;  Surgeon: Paula Compton, MD;  Location: [facility];  Service: Obstetrics;  Laterality: N/A;  . DILATION AND CURETTAGE OF UTERUS    . DILATION AND EVACUATION  04/12/2016   Procedure: DILATATION AND EVACUATION;  Surgeon: Cheri Fowler, MD;  Location: [facility];  Service: Gynecology;;  . Brigitte Pulse AND EVACUATION N/A 05/17/2017   Procedure: DILATATION AND EVACUATION;  Surgeon: Paula Compton, MD;  Location: [facility];  Service: Gynecology;  Laterality: N/A;  . DILATION AND EVACUATION N/A 04/10/2018   Procedure: DILATATION AND EVACUATION;  Surgeon: Paula Compton, MD;  Location: [facility];  Service: Gynecology;  Laterality: N/A;  . WISDOM TOOTH EXTRACTION      Current Outpatient Medications  Medication Sig Dispense Refill  . diltiazem (CARDIZEM) 30 MG tablet Take 1 tablet (30 mg total) by mouth every 6 (six) hours as needed (for elevated heart rate). 30 tablet 3  . sertraline (ZOLOFT) 50 MG tablet Take  1 tablet (50 mg total) by mouth daily. 90 tablet 3   No current facility-administered medications for this encounter.    No Known Allergies  Social History   Socioeconomic History  . Marital status: Married    Spouse name: Not on file  . Number of children: 1  . Years of education: Not on file  . Highest education level: Master's degree (e.g., MA, MS,  MEng, MEd, MSW, MBA)  Occupational History  . Occupation: Immunologist  Tobacco Use  . Smoking status: Never Smoker  . Smokeless tobacco: Never Used  Vaping Use  . Vaping Use: Never used  Substance and Sexual Activity  . Alcohol use: Yes    Alcohol/week: 1.0 standard drink    Types: 1 Glasses of wine per week    Comment: socially  . Drug use: No  . Sexual activity: Yes    Partners: Male    Birth control/protection: None    Comment: approx [redacted] wks gestation  Other Topics Concern  . Not on file  Social History Narrative   Pt is from [place]. Lives in [place]. 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    Social Determinants of Health   Financial Resource Strain:   . Difficulty of Paying Living Expenses: Not on file  Food Insecurity:   . Worried About Charity fundraiser in the Last Year: Not on file  . Ran Out of Food in the Last Year: Not on file  Transportation Needs:   . Lack of Transportation (Medical): Not on file  . Lack of Transportation (Non-Medical): Not on file  Physical Activity:   . Days of Exercise per Week: Not on file  . Minutes of Exercise per Session: Not on file  Stress:   . Feeling of Stress : Not on file  Social Connections:   . Frequency of Communication with Friends and Family: Not on file  . Frequency of Social Gatherings with Friends and Family: Not on file  . Attends Religious Services: Not on file  . Active Member of Clubs or Organizations: Not on file  . Attends Archivist Meetings: Not on file  . Marital Status: Not on file  Intimate Partner Violence:   . Fear of Current or Ex-Partner: Not on file  . Emotionally Abused: Not on file  . Physically Abused: Not on file  . Sexually Abused: Not on file     ROS- All systems are reviewed and negative except as per the HPI above.  Physical Exam: Vitals:   03/23/20 1523  BP: 92/68  Pulse: 85  Weight: 55.6 kg  Height: 5\' 3"  (1.6 m)    GEN- The patient is well  appearing, alert and oriented x 3 today.   Head- normocephalic, atraumatic Eyes-  Sclera clear, conjunctiva pink Ears- hearing intact Oropharynx- clear Neck- supple  Lungs- Clear to ausculation bilaterally, normal work of breathing Heart- Regular rate and rhythm, no murmurs, rubs or gallops  GI- soft, NT, ND, + BS Extremities- no clubbing, cyanosis, or edema MS- no significant deformity or atrophy Skin- no rash or lesion Psych- euthymic mood, full affect Neuro- strength and sensation are intact  Wt Readings from Last 3 Encounters:  03/23/20 55.6 kg  03/05/20 55.8 kg  02/12/20 56.2 kg    EKG today demonstrates SR HR 85, PR 140, QRS 80, QTc 445  Echo 01/27/20 demonstrated  1. Left ventricular ejection fraction, by estimation, is 70 to 75%. The  left ventricle has  hyperdynamic function. The left ventricle has no  regional wall motion abnormalities. Left ventricular diastolic parameters  were normal.  2. Right ventricular systolic function is normal. The right ventricular  size is normal. There is normal pulmonary artery systolic pressure.  3. The mitral valve is normal in structure. Trivial mitral valve  regurgitation.  4. The aortic valve is normal in structure. Aortic valve regurgitation is  not visualized.  5. The inferior vena cava is dilated in size with <50% respiratory  variability, suggesting right atrial pressure of 15 mmHg.   Epic records are reviewed at length today  CHA2DS2-VASc Score = 1  The patient's score is based upon: CHF History: 0 HTN History: 0 Age : 0 Diabetes History: 0 Stroke History: 0 Vascular Disease History: 0 Gender: 1      ASSESSMENT AND PLAN: 1. Paroxysmal Atrial Fibrillation (ICD10:  I48.0) The patient's CHA2DS2-VASc score is 1, indicating a 0.6% annual risk of stroke.   General education about afib provided and questions answered. We also discussed her stroke risk and the risks and benefits of anticoagulation. Unclear if her  recently diagnosed thyroid nodule is contributing to her afib as her thyroid function tests are normal. BP limits rate control therapy.  We discussed therapeutic options today including ablation. Given her young age and that she is highly symptomatic during the episodes, she may be a good candidate for first line ablation. Patient in agreement with plan. Will refer to EP. Continue diltiazem 30 mg PRN q 4 hours for heart racing.  2. VSD Spontaneous closure in childhood. Recent echo showed no evidence of VSD.  3. Thyroid nodule Scheduled for hemithyroidectomy 03/31/20.   Follow up with Dr Rayann Heman for ablation consideration.    [facility] [address] [place],  [address] [phone] 03/23/2020 3:58 PM

## 2020-03-31 ENCOUNTER — Ambulatory Visit: Payer: Self-pay | Admitting: Surgery

## 2020-04-01 ENCOUNTER — Telehealth: Payer: Self-pay | Admitting: *Deleted

## 2020-04-01 NOTE — Telephone Encounter (Signed)
   [facility] Pre-operative Risk Assessment    HEARTCARE STAFF: - Please ensure there is not already an duplicate clearance open for this procedure. - Under Visit Info/Reason for Call, type in Other and utilize the format Clearance MM/DD/YY or Clearance TBD. Do not use dashes or single digits. - If request is for dental extraction, please clarify the # of teeth to be extracted.  Request for surgical clearance:  1. What type of surgery is being performed? LEFT THYROID LOBECTOMY    2. When is this surgery scheduled? TBD   3. What type of clearance is required (medical clearance vs. Pharmacy clearance to hold med vs. Both)? MEDICAL  4. Are there any medications that need to be held prior to surgery and how long? NONE LISTED   5. Practice name and name of physician performing surgery? [facility]; DR. Harlow Asa   6. What is the office phone number? [phone]   7.   What is the office fax number? [phone]  8.   Anesthesia type (None, local, MAC, general) ? GENERAL   Julaine Hua 04/01/2020, 2:11 PM  _________________________________________________________________   (provider comments below)

## 2020-04-01 NOTE — Telephone Encounter (Addendum)
   Primary Cardiologist: Candee Furbish, MD  Chart reviewed as part of pre-operative protocol coverage. Patient was contacted 04/01/2020 in reference to pre-operative risk assessment for pending surgery as outlined below.  Jasmine Bell was last seen on 03/23/20 by Adline Peals PA-C with afib clinic and 03/14/20 by Ermalinda Barrios PA-C with general cardiology team. She has history of VSD with spontaneous closure as a child along with PAF and thyroid nodule. Atrial fib was diagnosed in July 2021. 2D echo 01/27/20 showed EF 70-75%, dilated IVC suggesting RA pressure of 27mmHg, no evidence of VSD outlined. At last OV 03/2020, she was in NSR, referred to EP to consider ablation.  Per protocol, patients with congenital heart defect require input from primary cardiologist on clearance. Will route to Dr. Marlou Porch for input on clearance for thyroid lobectomy under general anesthesia then patient will need call. Dr. Marlou Porch - Please route response to P [facility] PREOP (the pre-op pool). Thank you.   Charlie Pitter, PA-C 04/01/2020, 4:45 PM

## 2020-04-02 NOTE — Telephone Encounter (Signed)
OK to proceed with thyroid surgery. AFIB episodes are rare. Low overall risk No evidence of VSD on ECHO  Candee Furbish, MD

## 2020-04-02 NOTE — Telephone Encounter (Addendum)
   Primary Cardiologist: Candee Furbish, MD  Chart reviewed as part of pre-operative protocol coverage. Patient was contacted 04/02/2020 in reference to pre-operative risk assessment for pending surgery as outlined below.  Jasmine Bell was last seen on was last seen on 03/23/20 by Adline Peals PA-C with afib clinic and 03/14/20 by Ermalinda Barrios PA-C with general cardiology team. She has history of VSD with spontaneous closure as a child along with PAF and thyroid nodule. Atrial fib was diagnosed in July 2021. 2D echo 01/27/20 showed EF 70-75%, dilated IVC suggesting RA pressure of 40mmHg, no evidence of VSD outlined. At last OV 03/2020, she was in NSR, referred to EP to consider eventual ablation. I spoke with patient who affirms she is doing well without any new cardiac symptoms. Per Dr. Marlou Porch, [place] to proceed with thyroid surgery, low overall risk.  The patient was advised that if she develops new symptoms prior to surgery to contact our office to arrange for a follow-up visit, and she verbalized understanding.  I will route this recommendation to the requesting party via Epic fax function and remove from pre-op pool. Please call with questions.  Charlie Pitter, PA-C 04/02/2020, 8:55 AM

## 2020-04-03 NOTE — Patient Instructions (Addendum)
DUE TO COVID-19 ONLY ONE VISITOR IS ALLOWED TO COME WITH YOU AND STAY IN THE WAITING ROOM ONLY DURING PRE OP AND PROCEDURE DAY OF SURGERY. THE 1 VISITOR  MAY VISIT WITH YOU AFTER SURGERY IN YOUR PRIVATE ROOM DURING VISITING HOURS ONLY!  YOU NEED TO HAVE A COVID 19 TEST ON: 04/09/20 @ 2:55 PM , THIS TEST MUST BE DONE BEFORE SURGERY,  COVID TESTING SITE [place] [id number], IT IS ON THE RIGHT GOING OUT [address] APPROXIMATELY  2 MINUTES PAST [employer] ON THE RIGHT. ONCE YOUR COVID TEST IS COMPLETED,  PLEASE BEGIN THE QUARANTINE INSTRUCTIONS AS OUTLINED IN YOUR HANDOUT.                Jasmine Bell   Your procedure is scheduled on: 04/13/20   Report to [facility] Main  Entrance   Report to admitting at: 11:30 AM     Call this number if you have problems the morning of surgery [phone]    Remember: Do not eat solid food :After Midnight. Clear liquids from midnight until: 10:30 am.  CLEAR LIQUID DIET   Foods Allowed                                                                     Foods Excluded  Coffee and tea, regular and decaf                             liquids that you cannot  Plain Jell-O any favor except red or purple                                           see through such as: Fruit ices (not with fruit pulp)                                     milk, soups, orange juice  Iced Popsicles                                    All solid food Carbonated beverages, regular and diet                                    Cranberry, grape and apple juices Sports drinks like Gatorade Lightly seasoned clear broth or consume(fat free) Sugar, honey syrup  Sample Menu Breakfast                                Lunch                                     Supper Cranberry juice  Beef broth                            Chicken broth Jell-O                                     Grape juice                           Apple  juice Coffee or tea                        Jell-O                                      Popsicle                                                Coffee or tea                        Coffee or tea  _____________________________________________________________________  BRUSH YOUR TEETH MORNING OF SURGERY AND RINSE YOUR MOUTH OUT, NO CHEWING GUM CANDY OR MINTS.     Take these medicines the morning of surgery with A SIP OF WATER: sertraline. Diltiazem as needed.                               You may not have any metal on your body including hair pins and              piercings  Do not wear jewelry, make-up, lotions, powders or perfumes, deodorant             Do not wear nail polish on your fingernails.  Do not shave  48 hours prior to surgery.             Do not bring valuables to the hospital. [place].  Contacts, dentures or bridgework may not be worn into surgery.  Leave suitcase in the car. After surgery it may be brought to your room.     Patients discharged the day of surgery will not be allowed to drive home. IF YOU ARE HAVING SURGERY AND GOING HOME THE SAME DAY, YOU MUST HAVE AN ADULT TO DRIVE YOU HOME AND BE WITH YOU FOR 24 HOURS. YOU MAY GO HOME BY TAXI OR UBER OR ORTHERWISE, BUT AN ADULT MUST ACCOMPANY YOU HOME AND STAY WITH YOU FOR 24 HOURS.  Name and phone number of your driver:  Special Instructions: N/A              Please read over the following fact sheets you were given: ____________________________________________________________________          [facility] - Preparing for Surgery Before surgery, you can play an important role.  Because skin is not sterile, your skin needs to be as free of germs as possible.  You can reduce the number of germs on your skin by  washing with CHG (chlorahexidine gluconate) soap before surgery.  CHG is an antiseptic cleaner which kills germs and bonds with the skin to continue killing germs even  after washing. Please DO NOT use if you have an allergy to CHG or antibacterial soaps.  If your skin becomes reddened/irritated stop using the CHG and inform your nurse when you arrive at Short Stay. Do not shave (including legs and underarms) for at least 48 hours prior to the first CHG shower.  You may shave your face/neck. Please follow these instructions carefully:  1.  Shower with CHG Soap the night before surgery and the  morning of Surgery.  2.  If you choose to wash your hair, wash your hair first as usual with your  normal  shampoo.  3.  After you shampoo, rinse your hair and body thoroughly to remove the  shampoo.                           4.  Use CHG as you would any other liquid soap.  You can apply chg directly  to the skin and wash                       Gently with a scrungie or clean washcloth.  5.  Apply the CHG Soap to your body ONLY FROM THE NECK DOWN.   Do not use on face/ open                           Wound or open sores. Avoid contact with eyes, ears mouth and genitals (private parts).                       Wash face,  Genitals (private parts) with your normal soap.             6.  Wash thoroughly, paying special attention to the area where your surgery  will be performed.  7.  Thoroughly rinse your body with warm water from the neck down.  8.  DO NOT shower/wash with your normal soap after using and rinsing off  the CHG Soap.                9.  Pat yourself dry with a clean towel.            10.  Wear clean pajamas.            11.  Place clean sheets on your bed the night of your first shower and do not  sleep with pets. Day of Surgery : Do not apply any lotions/deodorants the morning of surgery.  Please wear clean clothes to the hospital/surgery center.  FAILURE TO FOLLOW THESE INSTRUCTIONS MAY RESULT IN THE CANCELLATION OF YOUR SURGERY PATIENT SIGNATURE_________________________________  NURSE  SIGNATURE__________________________________  ________________________________________________________________________

## 2020-04-06 ENCOUNTER — Encounter (HOSPITAL_COMMUNITY): Payer: Self-pay

## 2020-04-06 ENCOUNTER — Other Ambulatory Visit: Payer: Self-pay

## 2020-04-06 ENCOUNTER — Encounter (HOSPITAL_COMMUNITY)
Admission: RE | Admit: 2020-04-06 | Discharge: 2020-04-06 | Disposition: A | Discharge status: Home / Self Care | Payer: 59 | Source: Ambulatory Visit | Attending: Surgery | Admitting: Surgery

## 2020-04-06 DIAGNOSIS — Z01812 Encounter for preprocedural laboratory examination: Secondary | ICD-10-CM | POA: Insufficient documentation

## 2020-04-06 DIAGNOSIS — Z8774 Personal history of (corrected) congenital malformations of heart and circulatory system: Secondary | ICD-10-CM | POA: Insufficient documentation

## 2020-04-06 DIAGNOSIS — I480 Paroxysmal atrial fibrillation: Secondary | ICD-10-CM | POA: Insufficient documentation

## 2020-04-06 DIAGNOSIS — D440 Neoplasm of uncertain behavior of thyroid gland: Secondary | ICD-10-CM | POA: Insufficient documentation

## 2020-04-06 DIAGNOSIS — Z79899 Other long term (current) drug therapy: Secondary | ICD-10-CM | POA: Diagnosis not present

## 2020-04-06 DIAGNOSIS — F419 Anxiety disorder, unspecified: Secondary | ICD-10-CM | POA: Insufficient documentation

## 2020-04-06 LAB — CBC
HCT: 43.6 % (ref 36.0–46.0)
Hemoglobin: 14.4 g/dL (ref 12.0–15.0)
MCH: 29.7 pg (ref 26.0–34.0)
MCHC: 33 g/dL (ref 30.0–36.0)
MCV: 89.9 fL (ref 80.0–100.0)
Platelets: 253 10*3/uL (ref 150–400)
RBC: 4.85 MIL/uL (ref 3.87–5.11)
RDW: 12.8 % (ref 11.5–15.5)
WBC: 7.9 10*3/uL (ref 4.0–10.5)
nRBC: 0 % (ref 0.0–0.2)

## 2020-04-06 LAB — BASIC METABOLIC PANEL
Anion gap: 9 (ref 5–15)
BUN: 21 mg/dL — ABNORMAL HIGH (ref 6–20)
CO2: 24 mmol/L (ref 22–32)
Calcium: 9.4 mg/dL (ref 8.9–10.3)
Chloride: 104 mmol/L (ref 98–111)
Creatinine, Ser: 0.68 mg/dL (ref 0.44–1.00)
GFR calc Af Amer: >60 mL/min (ref >60)
GFR calc non Af Amer: >60 mL/min (ref >60)
Glucose, Bld: 86 mg/dL (ref 70–99)
Potassium: 3.9 mmol/L (ref 3.5–5.1)
Sodium: 137 mmol/L (ref 135–145)

## 2020-04-06 NOTE — Progress Notes (Signed)
COVID Vaccine Completed: Yes Date COVID Vaccine completed: 10/18/19 COVID vaccine manufacturer: [employer]    PCP - Maximiano Coss: NP. Cardiologist - Dr. Candee Furbish. LOV: 03/23/20. Epic: Clearance.  Chest x-ray - 01/08/20. EPIC EKG - 03/23/20. EPIC Stress Test -  ECHO - 01/27/20. EPIC Cardiac Cath -  Pacemaker/ICD device last checked:  Sleep Study -  CPAP -   Fasting Blood Sugar -  Checks Blood Sugar _____ times a day  Blood Thinner Instructions: Aspirin Instructions: Last Dose:  Anesthesia review: Hx: Afib. Pt. Verbalized feeling well,she said that her life is very active and she's not experience any distress.Also,the diltiazem is prn,and she have not use it yet.  Patient denies shortness of breath, fever, cough and chest pain at PAT appointment   Patient verbalized understanding of instructions that were given to them at the PAT appointment. Patient was also instructed that they will need to review over the PAT instructions again at home before surgery.

## 2020-04-09 ENCOUNTER — Other Ambulatory Visit (HOSPITAL_COMMUNITY)
Admission: RE | Admit: 2020-04-09 | Discharge: 2020-04-09 | Disposition: A | Discharge status: Home / Self Care | Payer: 59 | Source: Ambulatory Visit | Attending: Surgery | Admitting: Surgery

## 2020-04-09 DIAGNOSIS — Z01812 Encounter for preprocedural laboratory examination: Secondary | ICD-10-CM | POA: Diagnosis present

## 2020-04-09 DIAGNOSIS — Z20822 Contact with and (suspected) exposure to covid-19: Secondary | ICD-10-CM | POA: Insufficient documentation

## 2020-04-09 LAB — SARS CORONAVIRUS 2 (TAT 6-24 HRS): SARS Coronavirus 2: NEGATIVE

## 2020-04-09 NOTE — Anesthesia Preprocedure Evaluation (Addendum)
Anesthesia Evaluation  Patient identified by MRN, date of birth, ID band Patient awake    Reviewed: Allergy & Precautions, NPO status , Patient's Chart, lab work & pertinent test results  Airway Mallampati: I  TM Distance: >3 FB Neck ROM: Full    Dental  (+) Teeth Intact, Dental Advisory Given   Pulmonary neg pulmonary ROS,    breath sounds clear to auscultation       Cardiovascular + dysrhythmias Atrial Fibrillation  Rhythm:Regular Rate:Normal     Neuro/Psych Anxiety negative neurological ROS     GI/Hepatic negative GI ROS, Neg liver ROS,   Endo/Other  diabetes  Renal/GU negative Renal ROS     Musculoskeletal negative musculoskeletal ROS (+)   Abdominal Normal abdominal exam  (+)   Peds  Hematology negative hematology ROS (+)   Anesthesia Other Findings   Reproductive/Obstetrics                           Anesthesia Physical Anesthesia Plan  ASA: II  Anesthesia Plan: General   Post-op Pain Management:    Induction: Intravenous  PONV Risk Score and Plan: 4 or greater and Ondansetron, Dexamethasone, Midazolam and Scopolamine patch - Pre-op  Airway Management Planned: Oral ETT  Additional Equipment: None  Intra-op Plan:   Post-operative Plan: Extubation in OR  Informed Consent: I have reviewed the patients History and Physical, chart, labs and discussed the procedure including the risks, benefits and alternatives for the proposed anesthesia with the patient or authorized representative who has indicated his/her understanding and acceptance.     Dental advisory given  Plan Discussed with: CRNA  Anesthesia Plan Comments: (See PAT note 04/06/2020, Konrad Felix, PA-C  EKG: normal sinus rhythm.   Echo: 1. Left ventricular ejection fraction, by estimation, is 70 to 75%. The  left ventricle has hyperdynamic function. The left ventricle has no  regional wall motion  abnormalities. Left ventricular diastolic parameters  were normal.  2. Right ventricular systolic function is normal. The right ventricular  size is normal. There is normal pulmonary artery systolic pressure.  3. The mitral valve is normal in structure. Trivial mitral valve  regurgitation.  4. The aortic valve is normal in structure. Aortic valve regurgitation is  not visualized.  5. The inferior vena cava is dilated in size with <50% respiratory  variability, suggesting right atrial pressure of 15 mmHg. )      Anesthesia Quick Evaluation

## 2020-04-09 NOTE — Progress Notes (Signed)
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 never smoker with h/o VSD with spontaneous closure as a child, A-fib, thyroid neoplasm scheduled for above procedure 04/13/2020 with Dr. Armandina Gemma.   Per cardiology preoperative risk assessment 04/02/2020, "Chart reviewed as part of pre-operative protocol coverage. Patient was contacted 04/02/2020 in reference to pre-operative risk assessment for pending surgery as outlined below.  Jasmine Bell was last seen on was last seen on 03/23/20 by Adline Peals PA-C with afib clinic and 03/14/20 by Ermalinda Barrios PA-C with general cardiology team. She has history of VSD with spontaneous closure as a child along with PAF and thyroid nodule. Atrial fib was diagnosed in July 2021. 2D echo 01/27/20 showed EF 70-75%, dilated IVC suggesting RA pressure of 65mmHg, no evidence of VSD outlined. At last OV 03/2020, she was in NSR, referred to EP to consider eventual ablation. I spoke with patient who affirms she is doing well without any new cardiac symptoms. Per Dr. Marlou Porch, [place] to proceed with thyroid surgery, low overall risk."  Anticipate pt can proceed with planned procedure barring acute status change.   VS: BP 108/73   Pulse 71   Temp 37.2 C (Oral)   Ht 5\' 3"  (1.6 m)   Wt 54.9 kg   LMP 03/16/2020   BMI 21.43 kg/m   PROVIDERS: Maximiano Coss, NP is PCP   Candee Furbish, MD is Cardiologist  LABS: Labs reviewed: Acceptable for surgery. (all labs ordered are listed, but only abnormal results are displayed)  Labs Reviewed  BASIC METABOLIC PANEL - Abnormal; Notable for the following components:      Result Value   BUN 21 (*)    All other components within normal limits  CBC     IMAGES:   EKG: 03/23/2020 Rate 85 bpm   NSR  CV: Echo 01/27/2020 IMPRESSIONS    1. Left ventricular ejection fraction, by estimation, is 70 to 75%. The  left ventricle has hyperdynamic function. The left ventricle has no  regional wall motion abnormalities. Left ventricular diastolic parameters  were normal.  2. Right ventricular systolic function is normal. The right ventricular  size is normal. There is normal pulmonary artery systolic pressure.  3. The mitral valve is normal in structure. Trivial mitral valve  regurgitation.  4. The aortic valve is normal in structure. Aortic valve regurgitation is  not visualized.  5. The inferior vena cava is dilated in size with <50% respiratory  variability, suggesting right atrial pressure of 15 mmHg Past Medical History:  Diagnosis Date  . Anxiety   . Dysrhythmia    A-fib  . Gestational diabetes    gestetional  . History of infertility   . Infertility, female   . Newborn product of IVF pregnancy   . SVD (spontaneous vaginal delivery)    x 1    Past Surgical History:  Procedure Laterality Date  . aerolar cyst on breast removed    . BREAST SURGERY     areolar cyst removal  . CESAREAN SECTION N/A 09/23/2019   Procedure: CESAREAN SECTION;  Surgeon: Paula Compton, MD;  Location: [address] [facility] ORS;  Service: Obstetrics;  Laterality: N/A;  . DILATION AND CURETTAGE OF UTERUS    . DILATION AND EVACUATION  04/12/2016  Procedure: DILATATION AND EVACUATION;  Surgeon: Cheri Fowler, MD;  Location: [facility];  Service: Gynecology;;  . Brigitte Pulse AND EVACUATION N/A 05/17/2017   Procedure: DILATATION AND EVACUATION;  Surgeon: Paula Compton, MD;  Location: [facility];  Service: Gynecology;  Laterality: N/A;  . DILATION AND EVACUATION N/A 04/10/2018   Procedure: DILATATION AND EVACUATION;  Surgeon: Paula Compton, MD;  Location: [facility];  Service: Gynecology;  Laterality: N/A;  . WISDOM TOOTH EXTRACTION      MEDICATIONS: . diltiazem (CARDIZEM) 30 MG tablet  . sertraline (ZOLOFT)  50 MG tablet   No current facility-administered medications for this encounter.    Konrad Felix, PA-C [facility]-Surgical Testing [phone]

## 2020-04-10 ENCOUNTER — Ambulatory Visit: Payer: 59 | Admitting: Internal Medicine

## 2020-04-12 ENCOUNTER — Encounter (HOSPITAL_COMMUNITY): Payer: Self-pay | Admitting: Surgery

## 2020-04-12 DIAGNOSIS — D440 Neoplasm of uncertain behavior of thyroid gland: Secondary | ICD-10-CM | POA: Diagnosis present

## 2020-04-12 NOTE — H&P (Signed)
General Surgery [facility]  Garner Nash DOB: July 10, 1985 Married / Language: English / Race: White Female   History of Present Illness  The patient is a 34 year old female who presents with a thyroid nodule.  CHIEF COMPLAINT: thyroid neoplasm of uncertain behavior  Patient is referred by Dr. Philemon Kingdom for surgical evaluation and management of a newly diagnosed thyroid neoplasm of uncertain behavior. Patient's cardiologist is Dr. Lacie Draft. Patient had undergone a CT scan of the chest for evaluation of atrial fibrillation. Incidental finding was made of a left-sided thyroid nodule. Patient subsequently underwent a thyroid ultrasound on January 16, 2020. This demonstrated a 1.8 cm nodule in the mid left thyroid lobe which was felt to be moderately suspicious. Fine-needle aspiration biopsy was performed on January 23, 2020. This showed atypia of undetermined significance, Bethesda category III. Specimen was sent for molecular genetic testing. AFIRMA returned on March 03, 2020 with a result of suspicious, rendering a risk of malignancy of 50%. Patient is now referred by her endocrinologist for consideration for thyroid lobectomy for definitive diagnosis and management. Patient has no prior history of head or neck surgery. There is no history of other endocrine neoplasms. There is no family history of thyroid disease. Patient has never been on thyroid medication. 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   Past Surgical History Cesarean Section - 1   Diagnostic Studies History  Colonoscopy  never Mammogram  never  Allergies No Known Drug Allergies  [03/31/2020]  Medication History  Sertraline HCl (50MG  Tablet, Oral) Active. dilTIAZem HCl (30MG  Tablet, Oral) Active.  Social History Alcohol use  Occasional alcohol use. No caffeine use  No drug use  Tobacco use  Never smoker.  Family History  Arthritis  Father. Depression   Father.  Pregnancy / Birth History  Age at menarche  82 years. Irregular periods  Length (months) of breastfeeding  7-12 Maternal age  84-30 Para  2  Other Problems Anxiety Disorder  Atrial Fibrillation    Review of Systems  General Not Present- Appetite Loss, Chills, Fatigue, Fever, Night Sweats, Weight Gain and Weight Loss. Skin Not Present- Change in Wart/Mole, Dryness, Hives, Jaundice, New Lesions, Non-Healing Wounds, Rash and Ulcer. HEENT Not Present- Earache, Hearing Loss, Hoarseness, Nose Bleed, Oral Ulcers, Ringing in the Ears, Seasonal Allergies, Sinus Pain, Sore Throat, Visual Disturbances, Wears glasses/contact lenses and Yellow Eyes. Respiratory Not Present- Bloody sputum, Chronic Cough, Difficulty Breathing, Snoring and Wheezing. Breast Not Present- Breast Mass, Breast Pain, Nipple Discharge and Skin Changes. Musculoskeletal Not Present- Back Pain, Joint Pain, Joint Stiffness, Muscle Pain, Muscle Weakness and Swelling of Extremities.  Vitals  Weight: 123.38 lb Height: 63in Body Surface Area: 1.57 m Body Mass Index: 21.85 kg/m  Temp.: 98.34F  Pulse: 88 (Regular)  P.OX: 99% (Room air) BP: 118/72(Sitting, Left Arm, Standard)  Physical Exam   GENERAL APPEARANCE Development: normal Nutritional status: normal Gross deformities: none  SKIN Rash, lesions, ulcers: none Induration, erythema: none Nodules: none palpable  EYES Conjunctiva and lids: normal Pupils: equal and reactive Iris: normal bilaterally  EARS, NOSE, MOUTH, THROAT External ears: no lesion or deformity External nose: no lesion or deformity Hearing: grossly normal Due to Covid-19 pandemic, patient is wearing a mask.  NECK Symmetric: yes Trachea: midline Thyroid: Right thyroid lobe is without palpable abnormality; left thyroid lobe has a firm discrete mobile smooth nodule located centrally measuring a little over 1 cm in size. It is nontender. There is no associated  lymphadenopathy.  CHEST  Respiratory effort: normal Retraction or accessory muscle use: no Breath sounds: normal bilaterally Rales, rhonchi, wheeze: none  CARDIOVASCULAR Auscultation: regular rhythm, normal rate Murmurs: none Pulses: radial pulse 2+ palpable Lower extremity edema: none  ABDOMEN Distension: none Masses: none palpable Tenderness: none Hepatosplenomegaly: not present Hernia: There is a small fascial defect at the base of the umbilicus measuring approximately 5 mm in diameter. However, with Valsalva this does not prolapse. It is nontender.  MUSCULOSKELETAL Station and gait: normal Digits and nails: no clubbing or cyanosis Muscle strength: grossly normal all extremities Range of motion: grossly normal all extremities Deformity: none  LYMPHATIC Cervical: none palpable Supraclavicular: none palpable  PSYCHIATRIC Oriented to person, place, and time: yes Mood and affect: normal for situation Judgment and insight: appropriate for situation    Assessment & Plan  NEOPLASM OF UNCERTAIN BEHAVIOR OF THYROID GLAND (D44.0)  Patient presents today on referral from her endocrinologist to discuss a newly diagnosed left thyroid nodule with cytologic atypia.  Patient provided with a copy of "The Thyroid Book: Medical and Surgical Treatment of Thyroid Problems", published by [employer], 16 pages. Book reviewed and explained to patient during visit today.  We discussed the findings on fine-needle aspiration biopsy and/or molecular genetic testing. The nodule in the left thyroid lobe measures 1.8 cm. There are no other significant nodules present in the thyroid. I have recommended proceeding with left thyroid lobectomy for definitive diagnosis and management. We discussed the potential need for thyroid hormone replacement following surgery. We discussed the risk and benefits of the surgery including the risk of recurrent laryngeal nerve injury and injury to parathyroid  glands. We discussed the location and size of the cervical incision. We discussed the potential need for additional surgery for completion thyroidectomy. We discussed the potential need for radioactive iodine treatment. The patient understands and wishes to proceed with surgery in the near future.  The risks and benefits of the procedure have been discussed at length with the patient. The patient understands the proposed procedure, potential alternative treatments, and the course of recovery to be expected. All of the patient's questions have been answered at this time. The patient wishes to proceed with surgery.  Armandina Gemma, MD [facility] Office: [phone]

## 2020-04-13 ENCOUNTER — Ambulatory Visit (HOSPITAL_COMMUNITY)
Admission: RE | Admit: 2020-04-13 | Discharge: 2020-04-14 | Disposition: A | Discharge status: Home / Self Care | Payer: 59 | Attending: Surgery | Admitting: Surgery

## 2020-04-13 ENCOUNTER — Other Ambulatory Visit: Payer: Self-pay

## 2020-04-13 ENCOUNTER — Ambulatory Visit: Payer: 59 | Admitting: Internal Medicine

## 2020-04-13 ENCOUNTER — Ambulatory Visit (HOSPITAL_COMMUNITY): Payer: 59 | Admitting: Physician Assistant

## 2020-04-13 ENCOUNTER — Encounter (HOSPITAL_COMMUNITY): Payer: Self-pay | Admitting: Surgery

## 2020-04-13 ENCOUNTER — Ambulatory Visit (HOSPITAL_COMMUNITY): Payer: 59 | Admitting: Certified Registered"

## 2020-04-13 ENCOUNTER — Telehealth (HOSPITAL_COMMUNITY): Payer: Self-pay | Admitting: *Deleted

## 2020-04-13 ENCOUNTER — Encounter (HOSPITAL_COMMUNITY)
Admission: RE | Disposition: A | Discharge status: Home / Self Care | Payer: Self-pay | Source: Home / Self Care | Attending: Surgery

## 2020-04-13 DIAGNOSIS — F419 Anxiety disorder, unspecified: Secondary | ICD-10-CM | POA: Diagnosis not present

## 2020-04-13 DIAGNOSIS — I4891 Unspecified atrial fibrillation: Secondary | ICD-10-CM | POA: Diagnosis not present

## 2020-04-13 DIAGNOSIS — E041 Nontoxic single thyroid nodule: Secondary | ICD-10-CM | POA: Insufficient documentation

## 2020-04-13 DIAGNOSIS — Z818 Family history of other mental and behavioral disorders: Secondary | ICD-10-CM | POA: Diagnosis not present

## 2020-04-13 DIAGNOSIS — D440 Neoplasm of uncertain behavior of thyroid gland: Secondary | ICD-10-CM | POA: Diagnosis present

## 2020-04-13 DIAGNOSIS — Z8261 Family history of arthritis: Secondary | ICD-10-CM | POA: Diagnosis not present

## 2020-04-13 DIAGNOSIS — E119 Type 2 diabetes mellitus without complications: Secondary | ICD-10-CM | POA: Diagnosis not present

## 2020-04-13 DIAGNOSIS — Z79899 Other long term (current) drug therapy: Secondary | ICD-10-CM | POA: Diagnosis not present

## 2020-04-13 DIAGNOSIS — E063 Autoimmune thyroiditis: Secondary | ICD-10-CM | POA: Diagnosis not present

## 2020-04-13 HISTORY — PX: THYROID LOBECTOMY: SHX420

## 2020-04-13 LAB — PREGNANCY, URINE: Preg Test, Ur: NEGATIVE

## 2020-04-13 SURGERY — LOBECTOMY, THYROID
Anesthesia: General | Laterality: Left

## 2020-04-13 MED ORDER — ONDANSETRON HCL 4 MG/2ML IJ SOLN: 4.0000 mg | Freq: Four times a day (QID) | INTRAMUSCULAR | Status: DC | PRN

## 2020-04-13 MED ORDER — ROCURONIUM BROMIDE 10 MG/ML (PF) SYRINGE
PREFILLED_SYRINGE | INTRAVENOUS | Status: DC | PRN
  Administered 2020-04-13: 60 mg via INTRAVENOUS

## 2020-04-13 MED ORDER — TRAMADOL HCL 50 MG PO TABS
50.0000 mg | ORAL_TABLET | Freq: Four times a day (QID) | ORAL | Status: DC | PRN
  Administered 2020-04-13 – 2020-04-14 (×2): 50 mg via ORAL
  Filled 2020-04-13 (×2): qty 1

## 2020-04-13 MED ORDER — ONDANSETRON 4 MG PO TBDP: 4.0000 mg | ORAL_TABLET | Freq: Four times a day (QID) | ORAL | Status: DC | PRN

## 2020-04-13 MED ORDER — DEXAMETHASONE SODIUM PHOSPHATE 10 MG/ML IJ SOLN
INTRAMUSCULAR | Status: DC | PRN
  Administered 2020-04-13: 4 mg via INTRAVENOUS

## 2020-04-13 MED ORDER — MIDAZOLAM HCL 2 MG/2ML IJ SOLN
INTRAMUSCULAR | Status: DC | PRN
  Administered 2020-04-13: 2 mg via INTRAVENOUS

## 2020-04-13 MED ORDER — FENTANYL CITRATE (PF) 100 MCG/2ML IJ SOLN
INTRAMUSCULAR | Status: AC
  Filled 2020-04-13: qty 2

## 2020-04-13 MED ORDER — OXYCODONE HCL 5 MG PO TABS
5.0000 mg | ORAL_TABLET | Freq: Once | ORAL | Status: AC
  Administered 2020-04-13: 5 mg via ORAL

## 2020-04-13 MED ORDER — ONDANSETRON HCL 4 MG/2ML IJ SOLN
INTRAMUSCULAR | Status: AC
  Filled 2020-04-13: qty 2

## 2020-04-13 MED ORDER — ACETAMINOPHEN 10 MG/ML IV SOLN
INTRAVENOUS | Status: AC
  Filled 2020-04-13: qty 100

## 2020-04-13 MED ORDER — MIDAZOLAM HCL 2 MG/2ML IJ SOLN
INTRAMUSCULAR | Status: AC
  Filled 2020-04-13: qty 2

## 2020-04-13 MED ORDER — DEXAMETHASONE SODIUM PHOSPHATE 10 MG/ML IJ SOLN
INTRAMUSCULAR | Status: AC
  Filled 2020-04-13: qty 1

## 2020-04-13 MED ORDER — 0.9 % SODIUM CHLORIDE (POUR BTL) OPTIME
TOPICAL | Status: DC | PRN
  Administered 2020-04-13: 1000 mL

## 2020-04-13 MED ORDER — ONDANSETRON HCL 4 MG/2ML IJ SOLN
INTRAMUSCULAR | Status: DC | PRN
  Administered 2020-04-13: 4 mg via INTRAVENOUS

## 2020-04-13 MED ORDER — KETOROLAC TROMETHAMINE 30 MG/ML IJ SOLN
30.0000 mg | Freq: Once | INTRAMUSCULAR | Status: AC
  Administered 2020-04-13: 30 mg via INTRAVENOUS

## 2020-04-13 MED ORDER — ACETAMINOPHEN 650 MG RE SUPP: 650.0000 mg | Freq: Four times a day (QID) | RECTAL | Status: DC | PRN

## 2020-04-13 MED ORDER — ACETAMINOPHEN 325 MG PO TABS
650.0000 mg | ORAL_TABLET | Freq: Four times a day (QID) | ORAL | Status: DC | PRN
  Filled 2020-04-13: qty 2

## 2020-04-13 MED ORDER — LACTATED RINGERS IV SOLN
INTRAVENOUS | Status: DC
  Administered 2020-04-13: 1000 mL via INTRAVENOUS

## 2020-04-13 MED ORDER — CHLORHEXIDINE GLUCONATE 0.12 % MT SOLN
15.0000 mL | Freq: Once | OROMUCOSAL | Status: AC
  Administered 2020-04-13: 15 mL via OROMUCOSAL

## 2020-04-13 MED ORDER — LACTATED RINGERS IV SOLN: INTRAVENOUS | Status: DC

## 2020-04-13 MED ORDER — ACETAMINOPHEN 325 MG PO TABS: 325.0000 mg | ORAL_TABLET | Freq: Once | ORAL | Status: DC | PRN

## 2020-04-13 MED ORDER — DILTIAZEM HCL 30 MG PO TABS
30.0000 mg | ORAL_TABLET | Freq: Four times a day (QID) | ORAL | Status: DC | PRN
  Filled 2020-04-13: qty 1

## 2020-04-13 MED ORDER — ACETAMINOPHEN 10 MG/ML IV SOLN
1000.0000 mg | Freq: Once | INTRAVENOUS | Status: DC | PRN
  Administered 2020-04-13: 1000 mg via INTRAVENOUS

## 2020-04-13 MED ORDER — PROPOFOL 10 MG/ML IV BOLUS
INTRAVENOUS | Status: AC
  Filled 2020-04-13: qty 20

## 2020-04-13 MED ORDER — FENTANYL CITRATE (PF) 100 MCG/2ML IJ SOLN
25.0000 ug | INTRAMUSCULAR | Status: DC | PRN
  Administered 2020-04-13 (×3): 50 ug via INTRAVENOUS

## 2020-04-13 MED ORDER — KETOROLAC TROMETHAMINE 30 MG/ML IJ SOLN
INTRAMUSCULAR | Status: AC
  Filled 2020-04-13: qty 1

## 2020-04-13 MED ORDER — CEFAZOLIN SODIUM-DEXTROSE 2-4 GM/100ML-% IV SOLN
2.0000 g | INTRAVENOUS | Status: AC
  Administered 2020-04-13: 2 g via INTRAVENOUS
  Filled 2020-04-13: qty 100

## 2020-04-13 MED ORDER — CHLORHEXIDINE GLUCONATE CLOTH 2 % EX PADS: 6.0000 | MEDICATED_PAD | Freq: Once | CUTANEOUS | Status: DC

## 2020-04-13 MED ORDER — SUGAMMADEX SODIUM 200 MG/2ML IV SOLN
INTRAVENOUS | Status: DC | PRN
  Administered 2020-04-13: 110 mg via INTRAVENOUS

## 2020-04-13 MED ORDER — OXYCODONE HCL 5 MG PO TABS
ORAL_TABLET | ORAL | Status: AC
Start: 2020-04-13 — End: 2020-04-14
  Filled 2020-04-13: qty 1

## 2020-04-13 MED ORDER — HYDROMORPHONE HCL 1 MG/ML IJ SOLN: 1.0000 mg | INTRAMUSCULAR | Status: DC | PRN

## 2020-04-13 MED ORDER — PROPOFOL 10 MG/ML IV BOLUS
INTRAVENOUS | Status: DC | PRN
  Administered 2020-04-13: 150 mg via INTRAVENOUS

## 2020-04-13 MED ORDER — PROPOFOL 500 MG/50ML IV EMUL
INTRAVENOUS | Status: DC | PRN
  Administered 2020-04-13: 25 ug/kg/min via INTRAVENOUS

## 2020-04-13 MED ORDER — FENTANYL CITRATE (PF) 100 MCG/2ML IJ SOLN
INTRAMUSCULAR | Status: AC
Start: 2020-04-13 — End: 2020-04-14
  Filled 2020-04-13: qty 2

## 2020-04-13 MED ORDER — ROCURONIUM BROMIDE 10 MG/ML (PF) SYRINGE
PREFILLED_SYRINGE | INTRAVENOUS | Status: AC
  Filled 2020-04-13: qty 10

## 2020-04-13 MED ORDER — ACETAMINOPHEN 160 MG/5ML PO SOLN: 325.0000 mg | Freq: Once | ORAL | Status: DC | PRN

## 2020-04-13 MED ORDER — AMISULPRIDE (ANTIEMETIC) 5 MG/2ML IV SOLN: 10.0000 mg | Freq: Once | INTRAVENOUS | Status: DC | PRN

## 2020-04-13 MED ORDER — LIDOCAINE 2% (20 MG/ML) 5 ML SYRINGE
INTRAMUSCULAR | Status: AC
  Filled 2020-04-13: qty 5

## 2020-04-13 MED ORDER — OXYCODONE HCL 5 MG PO TABS
5.0000 mg | ORAL_TABLET | ORAL | Status: DC | PRN
  Administered 2020-04-13: 5 mg via ORAL
  Filled 2020-04-13: qty 1

## 2020-04-13 MED ORDER — SERTRALINE HCL 50 MG PO TABS
50.0000 mg | ORAL_TABLET | Freq: Every day | ORAL | Status: DC
  Filled 2020-04-13: qty 1

## 2020-04-13 MED ORDER — MEPERIDINE HCL 50 MG/ML IJ SOLN: 6.2500 mg | INTRAMUSCULAR | Status: DC | PRN

## 2020-04-13 MED ORDER — SODIUM CHLORIDE 0.45 % IV SOLN: INTRAVENOUS | Status: DC

## 2020-04-13 MED ORDER — FENTANYL CITRATE (PF) 100 MCG/2ML IJ SOLN
INTRAMUSCULAR | Status: DC | PRN
  Administered 2020-04-13 (×2): 50 ug via INTRAVENOUS

## 2020-04-13 MED ORDER — LIDOCAINE 2% (20 MG/ML) 5 ML SYRINGE
INTRAMUSCULAR | Status: DC | PRN
  Administered 2020-04-13: 40 mg via INTRAVENOUS

## 2020-04-13 MED ORDER — ORAL CARE MOUTH RINSE: 15.0000 mL | Freq: Once | OROMUCOSAL | Status: AC

## 2020-04-13 SURGICAL SUPPLY — 29 items
ATTRACTOMAT 16X20 MAGNETIC DRP (DRAPES) ×3 IMPLANT
BLADE SURG 15 STRL LF DISP TIS (BLADE) ×1 IMPLANT
BLADE SURG 15 STRL SS (BLADE) ×3
CHLORAPREP W/TINT 26 (MISCELLANEOUS) ×3 IMPLANT
CLIP VESOCCLUDE MED 6/CT (CLIP) ×9 IMPLANT
CLIP VESOCCLUDE SM WIDE 6/CT (CLIP) ×12 IMPLANT
COVER SURGICAL LIGHT HANDLE (MISCELLANEOUS) ×3 IMPLANT
COVER WAND RF STERILE (DRAPES) ×3 IMPLANT
DERMABOND ADVANCED (GAUZE/BANDAGES/DRESSINGS) ×2
DERMABOND ADVANCED .7 DNX12 (GAUZE/BANDAGES/DRESSINGS) ×1 IMPLANT
DRAPE LAPAROTOMY T 98X78 PEDS (DRAPES) ×3 IMPLANT
ELECT PENCIL ROCKER SW 15FT (MISCELLANEOUS) ×3 IMPLANT
ELECT REM PT RETURN 15FT ADLT (MISCELLANEOUS) ×3 IMPLANT
GAUZE 4X4 16PLY RFD (DISPOSABLE) ×3 IMPLANT
GLOVE SURG ORTHO 8.0 STRL STRW (GLOVE) ×3 IMPLANT
GOWN STRL REUS W/TWL XL LVL3 (GOWN DISPOSABLE) ×6 IMPLANT
HEMOSTAT SURGICEL 2X4 FIBR (HEMOSTASIS) ×3 IMPLANT
ILLUMINATOR WAVEGUIDE N/F (MISCELLANEOUS) ×3 IMPLANT
KIT BASIN OR (CUSTOM PROCEDURE TRAY) ×3 IMPLANT
KIT TURNOVER KIT A (KITS) ×3 IMPLANT
PACK BASIC VI WITH GOWN DISP (CUSTOM PROCEDURE TRAY) ×3 IMPLANT
SHEARS HARMONIC 9CM CVD (BLADE) ×3 IMPLANT
SUT MNCRL AB 4-0 PS2 18 (SUTURE) ×3 IMPLANT
SUT VIC AB 3-0 SH 18 (SUTURE) ×6 IMPLANT
SYR BULB IRRIG 60ML STRL (SYRINGE) ×3 IMPLANT
TOWEL OR 17X26 10 PK STRL BLUE (TOWEL DISPOSABLE) ×3 IMPLANT
TOWEL OR NON WOVEN STRL DISP B (DISPOSABLE) ×3 IMPLANT
TUBING CONNECTING 10 (TUBING) ×2 IMPLANT
TUBING CONNECTING 10' (TUBING) ×1

## 2020-04-13 NOTE — Op Note (Signed)
Procedure Note  Pre-operative Diagnosis:  Thyroid neoplasm of uncertain behavior, left thyroid nodule  Post-operative Diagnosis:  same  Surgeon:  Armandina Gemma, MD  Assistant:  Carlena Hurl, PA-C   Procedure:  Left thyroid lobectomy and isthmusectomy  Anesthesia:  General  Estimated Blood Loss:  minimal  Drains: none         Specimen: thyroid lobe to pathology  Indications:  Patient is referred by Dr. Philemon Kingdom for surgical evaluation and management of a newly diagnosed thyroid neoplasm of uncertain behavior. Patient's cardiologist is Dr. Lacie Draft. Patient had undergone a CT scan of the chest for evaluation of atrial fibrillation. Incidental finding was made of a left-sided thyroid nodule. Patient subsequently underwent a thyroid ultrasound on January 16, 2020. This demonstrated a 1.8 cm nodule in the mid left thyroid lobe which was felt to be moderately suspicious. Fine-needle aspiration biopsy was performed on January 23, 2020. This showed atypia of undetermined significance, Bethesda category III. Specimen was sent for molecular genetic testing. AFIRMA returned on March 03, 2020 with a result of suspicious, rendering a risk of malignancy of 50%. Patient is now referred by her endocrinologist for consideration for thyroid lobectomy for definitive diagnosis and management.  Procedure Details: Procedure was done in OR #5 at the [facility]. The patient was brought to the operating room and placed in a supine position on the operating room table. Following administration of general anesthesia, the patient was positioned and then prepped and draped in the usual aseptic fashion. After ascertaining that an adequate level of anesthesia had been achieved, a small Kocher incision was made with #15 blade. Dissection was carried through subcutaneous tissues and platysma. Hemostasis was achieved with the electrocautery. Skin flaps were elevated cephalad and caudad from the thyroid  notch to the sternal notch. A self-retaining retractor was placed for exposure. Strap muscles were incised in the midline and dissection was begun on the left side. Strap muscles were reflected laterally. The left thyroid lobe was normal in size with a palpable central nodule. The lobe was gently mobilized with blunt dissection. Superior pole vessels were dissected out and divided individually between small and medium ligaclips with the harmonic scalpel. The thyroid lobe was rolled anteriorly. Branches of the inferior thyroid artery were divided between small ligaclips with the harmonic scalpel. Inferior venous tributaries were divided between ligaclips. Both the superior and inferior parathyroid glands were identified and preserved on their vascular pedicles. The recurrent laryngeal nerve was identified and preserved along its course. The ligament of Gwenlyn Found was released with the electrocautery and the gland was mobilized onto the anterior trachea. Isthmus was mobilized across the midline. There was no significant pyramidal lobe present. The thyroid parenchyma was transected at the junction of the isthmus and contralateral thyroid lobe with the harmonic scalpel. The thyroid lobe and isthmus were submitted to pathology for review.  The neck was irrigated with warm saline. Fibrillar was placed throughout the operative field. Strap muscles were approximated in the midline with interrupted 3-0 Vicryl sutures. Platysma was closed with interrupted 3-0 Vicryl sutures. Skin was closed with a running 4-0 Monocryl subcuticular suture.  Wound was washed and dried and Dermabond was applied. The patient was awakened from anesthesia and brought to the recovery room. The patient tolerated the procedure well.   Armandina Gemma, MD [facility] Office: [phone]

## 2020-04-13 NOTE — Anesthesia Procedure Notes (Signed)
Procedure Name: Intubation Date/Time: 04/13/2020 11:23 AM Performed by: Niel Hummer, CRNA Pre-anesthesia Checklist: Emergency Drugs available, Patient identified, Suction available and Patient being monitored Patient Re-evaluated:Patient Re-evaluated prior to induction Oxygen Delivery Method: Circle system utilized Preoxygenation: Pre-oxygenation with 100% oxygen Induction Type: IV induction Ventilation: Mask ventilation without difficulty Laryngoscope Size: Mac and 3 Grade View: Grade I Tube type: Oral Tube size: 7.0 mm Airway Equipment and Method: Stylet Placement Confirmation: ETT inserted through vocal cords under direct vision,  positive ETCO2 and breath sounds checked- equal and bilateral Secured at: 21 cm Tube secured with: Tape Dental Injury: Teeth and Oropharynx as per pre-operative assessment

## 2020-04-13 NOTE — Discharge Instructions (Signed)
[facility]  THYROID & PARATHYROID SURGERY:  POST-OP INSTRUCTIONS  Always review your discharge instruction sheet from the facility where your surgery was performed.  A prescription for pain medication may be given to you upon discharge.  Take your pain medication as prescribed.  If narcotic pain medicine is not needed, then you may take acetaminophen (Tylenol) or ibuprofen (Advil) as needed.  Take your usually prescribed medications unless otherwise directed.  If you need a refill on your pain medication, please contact our office during regular business hours.  Prescriptions cannot be processed by our office after 5 pm or on weekends.  Start with a light diet upon arrival home, such as soup and crackers or toast.  Be sure to drink plenty of fluids daily.  Resume your normal diet the day after surgery.  Most patients will experience some swelling and bruising on the chest and neck area.  Ice packs will help.  Swelling and bruising can take several days to resolve.   It is common to experience some constipation after surgery.  Increasing fluid intake and taking a stool softener (Colace) will usually help or prevent this problem.  A mild laxative (Milk of Magnesia or Miralax) should be taken according to package directions if there has been no bowel movement after 48 hours.  You have steri-strips and a gauze dressing over your incision.  You may remove the gauze bandage on the second day after surgery, and you may shower at that time.  Leave your steri-strips (small skin tapes) in place directly over the incision.  These strips should remain on the skin for 5-7 days and then be removed.  You may get them wet in the shower and pat them dry.  You may resume regular (light) daily activities beginning the next day (such as daily self-care, walking, climbing stairs) gradually increasing activities as tolerated.  You may have sexual intercourse when it is comfortable.  Refrain from  any heavy lifting or straining until approved by your doctor.  You may drive when you no longer are taking prescription pain medication, you can comfortably wear a seatbelt, and you can safely maneuver your car and apply brakes.  You should see your doctor in the office for a follow-up appointment approximately three weeks after your surgery.  Make sure that you call for this appointment within a day or two after you arrive home to insure a convenient appointment time.  WHEN TO CALL YOUR DOCTOR: -- Fever greater than 101.5 -- Inability to urinate -- Nausea and/or vomiting - persistent -- Extreme swelling or bruising -- Continued bleeding from incision -- Increased pain, redness, or drainage from the incision -- Difficulty swallowing or breathing -- Muscle cramping or spasms -- Numbness or tingling in hands or around lips  The clinic staff is available to answer your questions during regular business hours.  Please don't hesitate to call and ask to speak to one of the nurses if you have concerns.  Talia Hoheisel, MD [facility] Office: [phone] 

## 2020-04-13 NOTE — Anesthesia Postprocedure Evaluation (Signed)
Anesthesia Post Note  Patient: Jasmine Bell  Procedure(s) Performed: THYROID LOBECTOMY (Left )     Patient location during evaluation: PACU Anesthesia Type: General Level of consciousness: awake and alert Pain management: pain level controlled Vital Signs Assessment: post-procedure vital signs reviewed and stable Respiratory status: spontaneous breathing, nonlabored ventilation, respiratory function stable and patient connected to nasal cannula oxygen Cardiovascular status: blood pressure returned to baseline and stable Postop Assessment: no apparent nausea or vomiting Anesthetic complications: no   No complications documented.  Last Vitals:  Vitals:   04/13/20 1345 04/13/20 1412  BP: 118/84 109/81  Pulse: 72 64  Resp: 18 14  Temp: 36.5 C 36.5 C  SpO2: 100% 99%    Last Pain:  Vitals:   04/13/20 1412  TempSrc: Oral  PainSc: 0-No pain                 Effie Berkshire

## 2020-04-13 NOTE — Interval H&P Note (Signed)
History and Physical Interval Note:  04/13/2020 10:46 AM  Jasmine Bell  has presented today for surgery, with the diagnosis of THYROID NEOPLASM OF UNCERTAIN BEHAVIOR.  The various methods of treatment have been discussed with the patient and family. After consideration of risks, benefits and other options for treatment, the patient has consented to    Procedure(s): THYROID LOBECTOMY (Left) as a surgical intervention.    The patient's history has been reviewed, patient examined, no change in status, stable for surgery.  I have reviewed the patient's chart and labs.  Questions were answered to the patient's satisfaction.    Armandina Gemma, MD [facility] Office: [place]

## 2020-04-13 NOTE — Transfer of Care (Signed)
Immediate Anesthesia Transfer of Care Note  Patient: Jasmine Bell  Procedure(s) Performed: THYROID LOBECTOMY (Left )  Patient Location: PACU  Anesthesia Type:General  Level of Consciousness: awake, alert  and oriented  Airway & Oxygen Therapy: Patient Spontanous Breathing and Patient connected to face mask oxygen  Post-op Assessment: Report given to RN, Post -op Vital signs reviewed and stable and Patient moving all extremities X 4  Post vital signs: Reviewed and stable  Last Vitals:  Vitals Value Taken Time  BP    Temp    Pulse 69 04/13/20 1243  Resp 11 04/13/20 1243  SpO2 100 % 04/13/20 1243  Vitals shown include unvalidated device data.  Last Pain:  Vitals:   04/13/20 1015  TempSrc: Oral         Complications: No complications documented.

## 2020-04-14 ENCOUNTER — Encounter (HOSPITAL_COMMUNITY): Payer: Self-pay | Admitting: Surgery

## 2020-04-14 DIAGNOSIS — E041 Nontoxic single thyroid nodule: Secondary | ICD-10-CM | POA: Diagnosis not present

## 2020-04-14 MED ORDER — TRAMADOL HCL 50 MG PO TABS: 50.0000 mg | ORAL_TABLET | Freq: Four times a day (QID) | ORAL | 0 refills | Status: DC | PRN

## 2020-04-14 NOTE — Progress Notes (Signed)
Discharge instructions discussed with patient, verbalized agreement and understanding 

## 2020-04-14 NOTE — Discharge Summary (Signed)
Physician Discharge Summary [facility]  Patient ID: Jasmine Bell MRN: [id number] DOB/AGE: 11-19-1985 34 y.o.  Admit date: 04/13/2020 Discharge date: 04/14/2020  Admission Diagnoses:  Thyroid neoplasm of uncertain behavior  Discharge Diagnoses:  Principal Problem:   Neoplasm of uncertain behavior of thyroid gland   Discharged Condition: good  Hospital Course: Patient was admitted for observation following thyroid surgery.  Post op course was uncomplicated.  Pain was well controlled.  Tolerated diet.  Patient was prepared for discharge home on POD#1.  Consults: None  Treatments: surgery: left thyroid lobectomy and isthmusectomy  Discharge Exam: Blood pressure 106/72, pulse (!) 58, temperature 97.6 F (36.4 C), temperature source Oral, resp. rate 17, height 5\' 3"  (1.6 m), weight 54.9 kg, last menstrual period 04/13/2020, SpO2 100 %, not currently breastfeeding. HEENT - clear Neck - wound dry and intact; mild STS; voice normal Chest - clear bilaterally Cor - RRR   Disposition: Home  Discharge Instructions    Diet - low sodium heart healthy   Complete by: As directed    Discharge instructions   Complete by: As directed    [facility]  THYROID & PARATHYROID SURGERY:  POST-OP INSTRUCTIONS  Always review your discharge instruction sheet from the facility where your surgery was performed.  A prescription for pain medication may be given to you upon discharge.  Take your pain medication as prescribed.  If narcotic pain medicine is not needed, then you may take acetaminophen (Tylenol) or ibuprofen (Advil) as needed.  Take your usually prescribed medications unless otherwise directed.  If you need a refill on your pain medication, please contact our office during regular business hours.  Prescriptions cannot be processed by our office after 5 pm or on weekends.  Start with a light diet upon arrival home, such as soup and crackers  or toast.  Be sure to drink plenty of fluids daily.  Resume your normal diet the day after surgery.  Most patients will experience some swelling and bruising on the chest and neck area.  Ice packs will help.  Swelling and bruising can take several days to resolve.   It is common to experience some constipation after surgery.  Increasing fluid intake and taking a stool softener (Colace) will usually help or prevent this problem.  A mild laxative (Milk of Magnesia or Miralax) should be taken according to package directions if there has been no bowel movement after 48 hours.  You have steri-strips and a gauze dressing over your incision.  You may remove the gauze bandage on the second day after surgery, and you may shower at that time.  Leave your steri-strips (small skin tapes) in place directly over the incision.  These strips should remain on the skin for 5-7 days and then be removed.  You may get them wet in the shower and pat them dry.  You may resume regular (light) daily activities beginning the next day (such as daily self-care, walking, climbing stairs) gradually increasing activities as tolerated.  You may have sexual intercourse when it is comfortable.  Refrain from any heavy lifting or straining until approved by your doctor.  You may drive when you no longer are taking prescription pain medication, you can comfortably wear a seatbelt, and you can safely maneuver your car and apply brakes.  You should see your doctor in the office for a follow-up appointment approximately three weeks after your surgery.  Make sure that you call for this appointment within  a day or two after you arrive home to insure a convenient appointment time.  WHEN TO CALL YOUR DOCTOR: -- Fever greater than 101.5 -- Inability to urinate -- Nausea and/or vomiting - persistent -- Extreme swelling or bruising -- Continued bleeding from incision -- Increased pain, redness, or drainage from the incision -- Difficulty  swallowing or breathing -- Muscle cramping or spasms -- Numbness or tingling in hands or around lips  The clinic staff is available to answer your questions during regular business hours.  Please don't hesitate to call and ask to speak to one of the nurses if you have concerns.  Armandina Gemma, MD [facility] Office: [phone]   Increase activity slowly   Complete by: As directed    No dressing needed   Complete by: As directed      Allergies as of 04/14/2020   No Known Allergies     Medication List    TAKE these medications   diltiazem 30 MG tablet Commonly known as: Cardizem Take 1 tablet (30 mg total) by mouth every 6 (six) hours as needed (for elevated heart rate).   loratadine 10 MG tablet Commonly known as: CLARITIN Take 10 mg by mouth daily.   sertraline 50 MG tablet Commonly known as: ZOLOFT Take 1 tablet (50 mg total) by mouth daily.   traMADol 50 MG tablet Commonly known as: ULTRAM Take 1-2 tablets (50-100 mg total) by mouth every 6 (six) hours as needed for moderate pain.            Discharge Care Instructions  (From admission, onward)         Start     Ordered   04/14/20 0000  No dressing needed        04/14/20 0300          Follow-up Information    Armandina Gemma, MD. Schedule an appointment as soon as possible for a visit in 3 weeks.   Specialty: General Surgery Why: For wound re-check Contact information: [place] [address] [phone]               Earnstine Regal, MD, [facility] Office: [phone]   Signed: Armandina Gemma 04/14/2020, 9:30 AM

## 2020-04-15 LAB — SURGICAL PATHOLOGY

## 2020-04-22 ENCOUNTER — Other Ambulatory Visit: Payer: Self-pay

## 2020-04-22 ENCOUNTER — Encounter: Payer: Self-pay | Admitting: Internal Medicine

## 2020-04-22 ENCOUNTER — Ambulatory Visit (INDEPENDENT_AMBULATORY_CARE_PROVIDER_SITE_OTHER): Payer: 59 | Admitting: Internal Medicine

## 2020-04-22 VITALS — BP 94/66 | HR 77 | Ht 63.0 in | Wt 126.2 lb

## 2020-04-22 DIAGNOSIS — I480 Paroxysmal atrial fibrillation: Secondary | ICD-10-CM

## 2020-04-22 DIAGNOSIS — I4891 Unspecified atrial fibrillation: Secondary | ICD-10-CM

## 2020-04-22 MED ORDER — RIVAROXABAN 20 MG PO TABS: 20.0000 mg | ORAL_TABLET | Freq: Every day | ORAL | 11 refills | Status: DC

## 2020-04-22 NOTE — Patient Instructions (Addendum)
Medication Instructions:  Your physician recommends that you continue on your current medications as directed. Please refer to the Current Medication list given to you today.  *If you need a refill on your cardiac medications before your next appointment, please call your pharmacy*  Lab Work: None ordered.  If you have labs (blood work) drawn today and your tests are completely normal, you will receive your results only by: [name] MyChart Message (if you have MyChart) OR . A paper copy in the mail If you have any lab test that is abnormal or we need to change your treatment, we will call you to review the results.  Testing/Procedures: Your physician has recommended that you have an ablation. Catheter ablation is a medical procedure used to treat some cardiac arrhythmias (irregular heartbeats). During catheter ablation, a long, thin, flexible tube is put into a blood vessel in your groin (upper thigh), or neck. This tube is called an ablation catheter. It is then guided to your heart through the blood vessel. Radio frequency waves destroy small areas of heart tissue where abnormal heartbeats may cause an arrhythmia to start. Please see the instruction sheet given to you today.   Follow-Up: At [facility], you and your health needs are our priority.  As part of our continuing mission to provide you with exceptional heart care, we have created designated Provider Care Teams.  These Care Teams include your primary Cardiologist (physician) and Advanced Practice Providers (APPs -  Physician Assistants and Nurse Practitioners) who all work together to provide you with the care you need, when you need it.  We recommend signing up for the patient portal called "MyChart".  Sign up information is provided on this After Visit Summary.  MyChart is used to connect with patients for Virtual Visits (Telemedicine).  Patients are able to view lab/test results, encounter notes, upcoming appointments, etc.  Non-urgent  messages can be sent to your provider as well.   To learn more about what you can do with MyChart, go to [email or web address].    Your next appointment:   Your physician wants you to follow-up in: Call [facility] when you pick ablation date. [phone] or [email or web address].    Other Instructions:

## 2020-04-22 NOTE — Progress Notes (Signed)
Electrophysiology Office Note   Date:  04/25/2020   ID:  Jasmine Bell, DOB [birth year], MRN [id number]  PCP:  Maximiano Coss, NP    Primary Electrophysiologist: Thompson Grayer, MD    CC: afib   History of Present Illness: Jasmine Bell is a 34 y.o. female who presents today for electrophysiology evaluation.   She has recently developed recurrent symptomatic afib. She reports tachypalpitations and fatigue.  She is unaware of triggers/ precipitants.   She recently underwent resection of a thyroid nodule (benign) but reports having afib prior to this. Her afib was diagnosed by ekg 01/08/20 after presenting with dizziness and palpitations.  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 Today, she denies symptoms of chest pain, shortness of breath, orthopnea, PND, lower extremity edema, claudication, dizziness, presyncope, syncope, bleeding, or neurologic sequela. The patient is tolerating medications without difficulties and is otherwise without complaint today.    Past Medical History:  Diagnosis Date   Anxiety    Gestational diabetes    gestetional   History of infertility    Infertility, female    Newborn product of IVF pregnancy    Paroxysmal atrial fibrillation (HCC)    A-fib   SVD (spontaneous vaginal delivery)    x 1   Past Surgical History:  Procedure Laterality Date   aerolar cyst on breast removed     BREAST SURGERY     areolar cyst removal   CESAREAN SECTION N/A 09/23/2019   Procedure: CESAREAN SECTION;  Surgeon: Paula Compton, MD;  Location: [facility];  Service: Obstetrics;  Laterality: N/A;   DILATION AND CURETTAGE OF UTERUS     DILATION AND EVACUATION  04/12/2016   Procedure: DILATATION AND EVACUATION;  Surgeon: Cheri Fowler, MD;  Location: [facility];  Service: Gynecology;;   DILATION AND EVACUATION N/A 05/17/2017   Procedure: DILATATION AND EVACUATION;  Surgeon: Paula Compton, MD;  Location: [facility];  Service:  Gynecology;  Laterality: N/A;   DILATION AND EVACUATION N/A 04/10/2018   Procedure: DILATATION AND EVACUATION;  Surgeon: Paula Compton, MD;  Location: [facility];  Service: Gynecology;  Laterality: N/A;   THYROID LOBECTOMY Left 04/13/2020   Procedure: THYROID LOBECTOMY;  Surgeon: Armandina Gemma, MD;  Location: [facility];  Service: General;  Laterality: Left;   WISDOM TOOTH EXTRACTION       Current Outpatient Medications  Medication Sig Dispense Refill   diltiazem (CARDIZEM) 30 MG tablet Take 1 tablet (30 mg total) by mouth every 6 (six) hours as needed (for elevated heart rate). 30 tablet 3   loratadine (CLARITIN) 10 MG tablet Take 10 mg by mouth daily.     sertraline (ZOLOFT) 50 MG tablet Take 1 tablet (50 mg total) by mouth daily. 90 tablet 3   rivaroxaban (XARELTO) 20 MG TABS tablet Take 1 tablet (20 mg total) by mouth daily with supper. 30 tablet 11   No current facility-administered medications for this visit.    Allergies:   Patient has no known allergies.   Social History:  The patient  reports that she has never smoked. She has never used smokeless tobacco. She reports current alcohol use of about 1.0 standard drink of alcohol per week. She reports that she does not use drugs.   Family History:  The patient's  family history includes Bipolar disorder in her paternal grandfather; Breast cancer in her maternal aunt; Cancer in her maternal grandmother; Depression in her father; Diabetes in her paternal aunt; Heart disease in her  paternal grandfather; Hyperlipidemia in her paternal grandfather; Mental illness in her father and maternal grandmother; Stroke in her maternal grandmother; Testicular cancer in her maternal grandfather.    ROS:  Please see the history of present illness.   All other systems are personally reviewed and negative.    PHYSICAL EXAM: VS:  BP 94/66    Pulse 77    Ht 5\' 3"  (1.6 m)    Wt 126 lb 3.2 oz (57.2 kg)    LMP 04/13/2020    SpO2 99%    BMI 22.36 kg/m  ,  BMI Body mass index is 22.36 kg/m. GEN: Well nourished, well developed, in no acute distress HEENT: normal Neck: no JVD, carotid bruits, or masses Cardiac: RRR; no murmurs, rubs, or gallops,no edema  Respiratory:  clear to auscultation bilaterally, normal work of breathing GI: soft, nontender, nondistended, + BS MS: no deformity or atrophy Skin: warm and dry  Neuro:  Strength and sensation are intact Psych: euthymic mood, full affect  EKG:  EKG is ordered today. The ekg ordered today is personally reviewed and shows sinus rhythm   Recent Labs: 01/08/2020: ALT 16; Magnesium 1.6; TSH 2.066 04/06/2020: BUN 21; Creatinine, Ser 0.68; Hemoglobin 14.4; Platelets 253; Potassium 3.9; Sodium 137  personally reviewed   Lipid Panel     Component Value Date/Time   CHOL 173 01/12/2018 1003   TRIG 60 01/12/2018 1003   HDL 66 01/12/2018 1003   CHOLHDL 2.6 01/12/2018 1003   CHOLHDL 2.5 09/26/2011 0900   VLDL 17 09/26/2011 0900   LDLCALC 95 01/12/2018 1003   personally reviewed   Wt Readings from Last 3 Encounters:  04/22/20 126 lb 3.2 oz (57.2 kg)  04/13/20 121 lb (54.9 kg)  04/06/20 121 lb (54.9 kg)      Other studies personally reviewed: Additional studies/ records that were reviewed today include: AF clinic records  Review of the above records today demonstrates: prior ekgs  Echo 01/27/20- EF 70%, normal RV size/ function  ASSESSMENT AND PLAN:  1.  Paroxysmal atrial fibrillation The patient has symptomatic, recurrent paroxysmal atrial fibrillation.  Chads2vasc score is 1. Therapeutic strategies for afib including medicine and ablation were discussed in detail with the patient today. Risk, benefits, and alternatives to EP study and radiofrequency ablation for afib were also discussed in detail today. These risks include but are not limited to stroke, bleeding, vascular damage, tamponade, perforation, damage to the esophagus, lungs, and other structures, pulmonary vein stenosis,  worsening renal function, and death. The patient understands these risk and wishes to proceed.  We will therefore proceed with catheter ablation at the next available time.  Carto, ICE, anesthesia are requested for the procedure.  Will also obtain cardiac CT prior to the procedure to exclude LAA thrombus and further evaluate atrial anatomy. We will also need to start xarelto 20mg  daily 3 weeks prior to ablation     Signed, Thompson Grayer, MD   [place] [address] [phone] (office) [phone] (fax)

## 2020-04-23 NOTE — Telephone Encounter (Signed)
Scheduled labs and covid screening. Advised that instruction letter will come via mychart for ablation and cardiac CT.  Patient verbalized understanding.

## 2020-04-28 ENCOUNTER — Encounter: Payer: Self-pay | Admitting: *Deleted

## 2020-04-28 ENCOUNTER — Other Ambulatory Visit: Payer: Self-pay

## 2020-04-28 ENCOUNTER — Other Ambulatory Visit: Payer: 59 | Admitting: *Deleted

## 2020-04-28 DIAGNOSIS — I4891 Unspecified atrial fibrillation: Secondary | ICD-10-CM

## 2020-04-28 MED ORDER — METOPROLOL TARTRATE 100 MG PO TABS: 100.0000 mg | ORAL_TABLET | Freq: Once | ORAL | 0 refills | Status: DC | PRN

## 2020-04-29 LAB — CBC WITH DIFFERENTIAL/PLATELET
Basophils Absolute: 0 10*3/uL (ref 0.0–0.2)
Basos: 1 %
EOS (ABSOLUTE): 0.1 10*3/uL (ref 0.0–0.4)
Eos: 2 %
Hematocrit: 43.1 % (ref 34.0–46.6)
Hemoglobin: 14.3 g/dL (ref 11.1–15.9)
Immature Grans (Abs): 0 10*3/uL (ref 0.0–0.1)
Immature Granulocytes: 0 %
Lymphocytes Absolute: 2.1 10*3/uL (ref 0.7–3.1)
Lymphs: 25 %
MCH: 30.2 pg (ref 26.6–33.0)
MCHC: 33.2 g/dL (ref 31.5–35.7)
MCV: 91 fL (ref 79–97)
Monocytes Absolute: 0.6 10*3/uL (ref 0.1–0.9)
Monocytes: 7 %
Neutrophils Absolute: 5.5 10*3/uL (ref 1.4–7.0)
Neutrophils: 65 %
Platelets: 274 10*3/uL (ref 150–450)
RBC: 4.73 x10E6/uL (ref 3.77–5.28)
RDW: 12.8 % (ref 11.7–15.4)
WBC: 8.4 10*3/uL (ref 3.4–10.8)

## 2020-04-29 LAB — BASIC METABOLIC PANEL
BUN/Creatinine Ratio: 22 (ref 9–23)
BUN: 15 mg/dL (ref 6–20)
CO2: 25 mmol/L (ref 20–29)
Calcium: 9.7 mg/dL (ref 8.7–10.2)
Chloride: 102 mmol/L (ref 96–106)
Creatinine, Ser: 0.69 mg/dL (ref 0.57–1.00)
GFR calc Af Amer: 130 mL/min/{1.73_m2} (ref >59)
GFR calc non Af Amer: 113 mL/min/{1.73_m2} (ref >59)
Glucose: 88 mg/dL (ref 65–99)
Potassium: 3.9 mmol/L (ref 3.5–5.2)
Sodium: 141 mmol/L (ref 134–144)

## 2020-05-11 ENCOUNTER — Ambulatory Visit (HOSPITAL_COMMUNITY): Payer: 59

## 2020-05-16 ENCOUNTER — Encounter: Payer: Self-pay | Admitting: Internal Medicine

## 2020-05-18 ENCOUNTER — Other Ambulatory Visit: Payer: Self-pay

## 2020-05-18 ENCOUNTER — Ambulatory Visit (HOSPITAL_COMMUNITY)
Admission: RE | Admit: 2020-05-18 | Discharge: 2020-05-18 | Disposition: A | Discharge status: Home / Self Care | Payer: 59 | Source: Ambulatory Visit | Attending: Internal Medicine | Admitting: Internal Medicine

## 2020-05-18 DIAGNOSIS — I4891 Unspecified atrial fibrillation: Secondary | ICD-10-CM

## 2020-05-18 MED ORDER — IOHEXOL 350 MG/ML SOLN
80.0000 mL | Freq: Once | INTRAVENOUS | Status: AC | PRN
  Administered 2020-05-18: 80 mL via INTRAVENOUS

## 2020-05-19 ENCOUNTER — Telehealth: Payer: Self-pay

## 2020-05-19 NOTE — Telephone Encounter (Signed)
I just sent her a message about this in [place]

## 2020-05-19 NOTE — Telephone Encounter (Signed)
Please see below.

## 2020-05-19 NOTE — Telephone Encounter (Signed)
New message     The patient is asking for a call back from CMA to discuss rejection letter -  Out of pocket $ 5400 thyroid biopsy.

## 2020-05-23 ENCOUNTER — Other Ambulatory Visit (HOSPITAL_COMMUNITY)
Admission: RE | Admit: 2020-05-23 | Discharge: 2020-05-23 | Disposition: A | Discharge status: Home / Self Care | Payer: 59 | Source: Ambulatory Visit | Attending: Internal Medicine | Admitting: Internal Medicine

## 2020-05-23 DIAGNOSIS — Z20822 Contact with and (suspected) exposure to covid-19: Secondary | ICD-10-CM | POA: Diagnosis not present

## 2020-05-23 DIAGNOSIS — Z01812 Encounter for preprocedural laboratory examination: Secondary | ICD-10-CM | POA: Diagnosis not present

## 2020-05-23 LAB — SARS CORONAVIRUS 2 (TAT 6-24 HRS): SARS Coronavirus 2: NEGATIVE

## 2020-05-25 NOTE — Progress Notes (Signed)
Instructed patient on the following items: Arrival time 1130 Nothing to eat or drink after midnight No meds AM of procedure Responsible person to drive you home and stay with you for 24 hrs  Have you missed any doses of anti-coagulant Xarelto- hasn't missed any doses    

## 2020-05-26 ENCOUNTER — Encounter (HOSPITAL_COMMUNITY)
Admission: RE | Disposition: A | Discharge status: Home / Self Care | Payer: 59 | Source: Home / Self Care | Attending: Internal Medicine

## 2020-05-26 ENCOUNTER — Ambulatory Visit (HOSPITAL_COMMUNITY)
Admission: RE | Admit: 2020-05-26 | Discharge: 2020-05-26 | Disposition: A | Discharge status: Home / Self Care | Payer: 59 | Attending: Internal Medicine | Admitting: Internal Medicine

## 2020-05-26 ENCOUNTER — Ambulatory Visit (HOSPITAL_BASED_OUTPATIENT_CLINIC_OR_DEPARTMENT_OTHER): Payer: 59

## 2020-05-26 ENCOUNTER — Ambulatory Visit (HOSPITAL_COMMUNITY): Payer: 59 | Admitting: Certified Registered Nurse Anesthetist

## 2020-05-26 ENCOUNTER — Other Ambulatory Visit: Payer: Self-pay

## 2020-05-26 DIAGNOSIS — Z79899 Other long term (current) drug therapy: Secondary | ICD-10-CM | POA: Diagnosis not present

## 2020-05-26 DIAGNOSIS — Q211 Atrial septal defect: Secondary | ICD-10-CM | POA: Insufficient documentation

## 2020-05-26 DIAGNOSIS — Z7901 Long term (current) use of anticoagulants: Secondary | ICD-10-CM | POA: Insufficient documentation

## 2020-05-26 DIAGNOSIS — I480 Paroxysmal atrial fibrillation: Secondary | ICD-10-CM | POA: Diagnosis present

## 2020-05-26 HISTORY — PX: ATRIAL FIBRILLATION ABLATION: EP1191

## 2020-05-26 HISTORY — PX: TEE WITHOUT CARDIOVERSION: SHX5443

## 2020-05-26 LAB — PREGNANCY, URINE: Preg Test, Ur: NEGATIVE

## 2020-05-26 LAB — POCT ACTIVATED CLOTTING TIME: Activated Clotting Time: 285 seconds

## 2020-05-26 SURGERY — ATRIAL FIBRILLATION ABLATION
Anesthesia: General

## 2020-05-26 MED ORDER — ROCURONIUM BROMIDE 10 MG/ML (PF) SYRINGE
PREFILLED_SYRINGE | INTRAVENOUS | Status: DC | PRN
  Administered 2020-05-26: 60 mg via INTRAVENOUS

## 2020-05-26 MED ORDER — SODIUM CHLORIDE 0.9 % IV SOLN: INTRAVENOUS | Status: DC

## 2020-05-26 MED ORDER — ACETAMINOPHEN 325 MG PO TABS
650.0000 mg | ORAL_TABLET | ORAL | Status: DC | PRN
  Administered 2020-05-26: 650 mg via ORAL

## 2020-05-26 MED ORDER — PROPOFOL 10 MG/ML IV BOLUS
INTRAVENOUS | Status: DC | PRN
  Administered 2020-05-26 (×2): 40 mg via INTRAVENOUS
  Administered 2020-05-26: 120 mg via INTRAVENOUS

## 2020-05-26 MED ORDER — HEPARIN (PORCINE) IN NACL 1000-0.9 UT/500ML-% IV SOLN
INTRAVENOUS | Status: DC | PRN
  Administered 2020-05-26: 500 mL

## 2020-05-26 MED ORDER — LIDOCAINE 2% (20 MG/ML) 5 ML SYRINGE
INTRAMUSCULAR | Status: DC | PRN
  Administered 2020-05-26: 40 mg via INTRAVENOUS

## 2020-05-26 MED ORDER — HEPARIN SODIUM (PORCINE) 1000 UNIT/ML IJ SOLN
INTRAMUSCULAR | Status: AC
  Filled 2020-05-26: qty 1

## 2020-05-26 MED ORDER — ACETAMINOPHEN 325 MG PO TABS
ORAL_TABLET | ORAL | Status: AC
  Filled 2020-05-26: qty 2

## 2020-05-26 MED ORDER — FENTANYL CITRATE (PF) 250 MCG/5ML IJ SOLN
INTRAMUSCULAR | Status: DC | PRN
  Administered 2020-05-26: 100 ug via INTRAVENOUS

## 2020-05-26 MED ORDER — PROTAMINE SULFATE 10 MG/ML IV SOLN
INTRAVENOUS | Status: DC | PRN
  Administered 2020-05-26: 40 mg via INTRAVENOUS

## 2020-05-26 MED ORDER — ISOPROTERENOL HCL 0.2 MG/ML IJ SOLN
INTRAMUSCULAR | Status: AC
  Filled 2020-05-26: qty 5

## 2020-05-26 MED ORDER — ONDANSETRON HCL 4 MG/2ML IJ SOLN: 4.0000 mg | Freq: Four times a day (QID) | INTRAMUSCULAR | Status: DC | PRN

## 2020-05-26 MED ORDER — ISOPROTERENOL HCL 0.2 MG/ML IJ SOLN
INTRAVENOUS | Status: DC | PRN
  Administered 2020-05-26: 20 ug/min via INTRAVENOUS

## 2020-05-26 MED ORDER — HEPARIN SODIUM (PORCINE) 1000 UNIT/ML IJ SOLN
INTRAMUSCULAR | Status: DC | PRN
  Administered 2020-05-26: 3000 [IU] via INTRAVENOUS

## 2020-05-26 MED ORDER — ONDANSETRON HCL 4 MG/2ML IJ SOLN
INTRAMUSCULAR | Status: DC | PRN
  Administered 2020-05-26: 4 mg via INTRAVENOUS

## 2020-05-26 MED ORDER — SUGAMMADEX SODIUM 200 MG/2ML IV SOLN
INTRAVENOUS | Status: DC | PRN
  Administered 2020-05-26: 150 mg via INTRAVENOUS

## 2020-05-26 MED ORDER — HYDROCODONE-ACETAMINOPHEN 5-325 MG PO TABS: 1.0000 | ORAL_TABLET | ORAL | Status: DC | PRN

## 2020-05-26 MED ORDER — SODIUM CHLORIDE 0.9% FLUSH: 3.0000 mL | Freq: Two times a day (BID) | INTRAVENOUS | Status: DC

## 2020-05-26 MED ORDER — PANTOPRAZOLE SODIUM 40 MG PO TBEC: 40.0000 mg | DELAYED_RELEASE_TABLET | Freq: Every day | ORAL | 0 refills | Status: DC

## 2020-05-26 MED ORDER — HEPARIN SODIUM (PORCINE) 1000 UNIT/ML IJ SOLN
INTRAMUSCULAR | Status: DC | PRN
  Administered 2020-05-26: 1000 [IU] via INTRAVENOUS
  Administered 2020-05-26: 14000 [IU] via INTRAVENOUS

## 2020-05-26 MED ORDER — MIDAZOLAM HCL 2 MG/2ML IJ SOLN
INTRAMUSCULAR | Status: DC | PRN
  Administered 2020-05-26: 2 mg via INTRAVENOUS

## 2020-05-26 MED ORDER — DEXAMETHASONE SODIUM PHOSPHATE 10 MG/ML IJ SOLN
INTRAMUSCULAR | Status: DC | PRN
  Administered 2020-05-26: 8 mg via INTRAVENOUS

## 2020-05-26 SURGICAL SUPPLY — 18 items
BLANKET WARM UNDERBOD FULL ACC (MISCELLANEOUS) ×3 IMPLANT
CATH 8FR REPROCESSED SOUNDSTAR (CATHETERS) ×3 IMPLANT
CATH MAPPNG PENTARAY F 2-6-2MM (CATHETERS) ×1 IMPLANT
CATH SMTCH THERMOCOOL SF DF (CATHETERS) ×3 IMPLANT
CATH WEBSTER BI DIR CS D-F CRV (CATHETERS) ×3 IMPLANT
CLOSURE PERCLOSE PROSTYLE (VASCULAR PRODUCTS) ×9 IMPLANT
COVER SWIFTLINK CONNECTOR (BAG) ×3 IMPLANT
NEEDLE BAYLIS TRANSSEPTAL 71CM (NEEDLE) ×3 IMPLANT
PACK EP LATEX FREE (CUSTOM PROCEDURE TRAY) ×3
PACK EP LF (CUSTOM PROCEDURE TRAY) ×1 IMPLANT
PAD PRO RADIOLUCENT 2001M-C (PAD) ×3 IMPLANT
PATCH CARTO3 (PAD) ×3 IMPLANT
PENTARAY F 2-6-2MM (CATHETERS) ×3
SHEATH PINNACLE 7F 10CM (SHEATH) ×6 IMPLANT
SHEATH PINNACLE 9F 10CM (SHEATH) ×3 IMPLANT
SHEATH PROBE COVER 6X72 (BAG) ×3 IMPLANT
SHEATH SWARTZ TS SL2 63CM 8.5F (SHEATH) ×3 IMPLANT
TUBING SMART ABLATE COOLFLOW (TUBING) ×3 IMPLANT

## 2020-05-26 NOTE — Progress Notes (Signed)
  Echocardiogram 2D Echocardiogram has been performed.  Jasmine Bell 05/26/2020, 2:25 PM

## 2020-05-26 NOTE — Discharge Instructions (Signed)
Post procedure care instructions No driving for 4 days. No lifting over 5 lbs for 1 week. No vigorous or sexual activity for 1 week. You may return to work/your usual activities on 06/03/2020. Keep procedure site clean & dry. If you notice increased pain, swelling, bleeding or pus, call/return!  You may shower after 24 hours, but no soaking in baths/hot tubs/pools for 1 week.   You have an appointment set up with the [facility].  Multiple studies have shown that being followed by a dedicated atrial fibrillation clinic in addition to the standard care you receive from your other physicians improves health. We believe that enrollment in the atrial fibrillation clinic will allow [place] to better care for you.   The phone number to the [facility] is [phone]. The clinic is staffed Monday through Friday from 8:30am to 5pm.  Parking Directions: The clinic is located in the Heart and Vascular Building connected to [facility]. 1)From [address] turn on to [address] and go to the 3rd entrance  (Heart and Vascular entrance) on the right. 2)Look to the right for Heart &Vascular Parking Garage. 3)A code for the entrance is required, for December is 3007   4)Take the elevators to the 1st floor. Registration is in the room with the glass walls at the end of the hallway.  If you have any trouble parking or locating the clinic, please don't hesitate to call [phone].

## 2020-05-26 NOTE — Procedures (Addendum)
    TRANSESOPHAGEAL ECHOCARDIOGRAM   NAME:  Jasmine Bell   MRN: [id number] DOB:  03-25-1986   ADMIT DATE: 05/26/2020  INDICATIONS: ASD, pending atrial fibrillation ablation  PROCEDURE:   See full ablation note and Dr. Jackalyn Lombard note. Consent for TEE obtained as part of atrial fibrillation ablation procedure. Anesthesia totals will documented as a part of the ablation.  The transesophageal probe was inserted in the esophagus and stomach without difficulty and multiple views were obtained.    COMPLICATIONS:    There were no immediate complications.  FINDINGS:  LEFT VENTRICLE: EF = 70-75%. No regional wall motion abnormalities.  RIGHT VENTRICLE: Normal size and function.   LEFT ATRIUM: No thrombus/mass.  LEFT ATRIAL APPENDAGE: No thrombus/mass.   RIGHT ATRIUM: No thrombus/mass.  AORTIC VALVE:  Trileaflet. No regurgitation. No vegetation.  MITRAL VALVE:    Normal structure. Trivial regurgitation. No vegetation.  TRICUSPID VALVE: Normal structure. Mild regurgitation. No vegetation.  PULMONIC VALVE: Grossly normal structure. Trivial regurgitation. No apparent vegetation.  INTERATRIAL SEPTUM: ASD seen by color Doppler. This is measured as approximately 45 mm with left to right shunting. Bubble study shows no right to left flow.  PERICARDIUM: No effusion noted.  DESCENDING AORTA: No plaque seen   CONCLUSION: No evidence of LA/LAA or RA/RAA thrombus. ASD seen with left to right flow, bubble study negative for right to left flow. No evidence of VSD.   Buford Dresser, MD, PhD [facility]  [address] [place], [place] [address] [phone]   2:14 PM

## 2020-05-26 NOTE — Transfer of Care (Signed)
Immediate Anesthesia Transfer of Care Note  Patient: Jasmine Bell  Procedure(s) Performed: ATRIAL FIBRILLATION ABLATION (N/A ) TRANSESOPHAGEAL ECHOCARDIOGRAM (TEE) (N/A )  Patient Location: PACU and Cath Lab  Anesthesia Type:General  Level of Consciousness: awake, alert , oriented, patient cooperative and responds to stimulation  Airway & Oxygen Therapy: Patient Spontanous Breathing and Patient connected to nasal cannula oxygen  Post-op Assessment: Report given to RN, Post -op Vital signs reviewed and stable and Patient moving all extremities X 4  Post vital signs: Reviewed and stable  Last Vitals:  Vitals Value Taken Time  BP 106/64 05/26/20 1554  Temp 36.5 C 05/26/20 1555  Pulse 94 05/26/20 1557  Resp 15 05/26/20 1557  SpO2 99 % 05/26/20 1557  Vitals shown include unvalidated device data.  Last Pain: There were no vitals filed for this visit.       Complications: No complications documented.

## 2020-05-26 NOTE — Progress Notes (Signed)
Patient was given discharge instructions. She verbalized understanding. 

## 2020-05-26 NOTE — H&P (Signed)
CC: afib   History of Present Illness: Jasmine Bell is a 34 y.o. female who presents today for electrophysiology study and ablation for afib.   She has recently developed recurrent symptomatic afib. She reports tachypalpitations and fatigue.  She is unaware of triggers/ precipitants.   She recently underwent resection of a thyroid nodule (benign) but reports having afib prior to this. Her afib was diagnosed by ekg 01/08/20 after presenting with dizziness and palpitations.  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 Today, she denies symptoms of chest pain, shortness of breath, orthopnea, PND, lower extremity edema, claudication, dizziness, presyncope, syncope, bleeding, or neurologic sequela. The patient is tolerating medications without difficulties and is otherwise without complaint today.        Past Medical History:  Diagnosis Date  . Anxiety   . Gestational diabetes    gestetional  . History of infertility   . Infertility, female   . Newborn product of IVF pregnancy   . Paroxysmal atrial fibrillation (HCC)    A-fib  . SVD (spontaneous vaginal delivery)    x 1        Past Surgical History:  Procedure Laterality Date  . aerolar cyst on breast removed    . BREAST SURGERY     areolar cyst removal  . CESAREAN SECTION N/A 09/23/2019   Procedure: CESAREAN SECTION;  Surgeon: Paula Compton, MD;  Location: [facility] [facility];  Service: Obstetrics;  Laterality: N/A;  . DILATION AND CURETTAGE OF UTERUS    . DILATION AND EVACUATION  04/12/2016   Procedure: DILATATION AND EVACUATION;  Surgeon: Cheri Fowler, MD;  Location: [facility];  Service: Gynecology;;  . Brigitte Pulse AND EVACUATION N/A 05/17/2017   Procedure: DILATATION AND EVACUATION;  Surgeon: Paula Compton, MD;  Location: [facility];  Service: Gynecology;  Laterality: N/A;  . DILATION AND EVACUATION N/A 04/10/2018   Procedure: DILATATION AND EVACUATION;  Surgeon: Paula Compton, MD;   Location: [facility];  Service: Gynecology;  Laterality: N/A;  . THYROID LOBECTOMY Left 04/13/2020   Procedure: THYROID LOBECTOMY;  Surgeon: Armandina Gemma, MD;  Location: [facility];  Service: General;  Laterality: Left;  . WISDOM TOOTH EXTRACTION             Current Outpatient Medications  Medication Sig Dispense Refill  . diltiazem (CARDIZEM) 30 MG tablet Take 1 tablet (30 mg total) by mouth every 6 (six) hours as needed (for elevated heart rate). 30 tablet 3  . loratadine (CLARITIN) 10 MG tablet Take 10 mg by mouth daily.    . sertraline (ZOLOFT) 50 MG tablet Take 1 tablet (50 mg total) by mouth daily. 90 tablet 3  . rivaroxaban (XARELTO) 20 MG TABS tablet Take 1 tablet (20 mg total) by mouth daily with supper. 30 tablet 11   No current facility-administered medications for this visit.    Allergies:   Patient has no known allergies.   Social History:  The patient  reports that she has never smoked. She has never used smokeless tobacco. She reports current alcohol use of about 1.0 standard drink of alcohol per week. She reports that she does not use drugs.   Family History:  The patient's  family history includes Bipolar disorder in her paternal grandfather; Breast cancer in her maternal aunt; Cancer in her maternal grandmother; Depression in her father; Diabetes in her paternal aunt; Heart disease in her paternal grandfather; Hyperlipidemia in her paternal grandfather; Mental illness in her father and maternal grandmother; Stroke in her  maternal grandmother; Testicular cancer in her maternal grandfather.    ROS:  Please see the history of present illness.   All other systems are personally reviewed and negative.    PHYSICAL EXAM: Vitals:   05/26/20 1143  BP: 105/78  Pulse: 71  Temp: (!) 97.5 F (36.4 C)  SpO2: 100%    GEN: Well nourished, well developed, in no acute distress HEENT: normal Neck: no JVD, carotid bruits, or masses Cardiac: RRR; no murmurs, rubs, or  gallops,no edema  Respiratory:  clear to auscultation bilaterally, normal work of breathing GI: soft, nontender, nondistended, + BS MS: no deformity or atrophy Skin: warm and dry  Neuro:  Strength and sensation are intact Psych: euthymic mood, full affect  EKG:  EKG is ordered today. The ekg ordered today is personally reviewed and shows sinus rhythm   Recent Labs: 01/08/2020: ALT 16; Magnesium 1.6; TSH 2.066 04/06/2020: BUN 21; Creatinine, Ser 0.68; Hemoglobin 14.4; Platelets 253; Potassium 3.9; Sodium 137  personally reviewed   Lipid Panel  Labs (Brief)          Component Value Date/Time   CHOL 173 01/12/2018 1003   TRIG 60 01/12/2018 1003   HDL 66 01/12/2018 1003   CHOLHDL 2.6 01/12/2018 1003   CHOLHDL 2.5 09/26/2011 0900   VLDL 17 09/26/2011 0900   LDLCALC 95 01/12/2018 1003     personally reviewed      Wt Readings from Last 3 Encounters:  04/22/20 126 lb 3.2 oz (57.2 kg)  04/13/20 121 lb (54.9 kg)  04/06/20 121 lb (54.9 kg)      Other studies personally reviewed: Additional studies/ records that were reviewed today include: AF clinic records  Review of the above records today demonstrates: prior ekgs  Echo 01/27/20- EF 70%, normal RV size/ function  ASSESSMENT AND PLAN:  1.  Paroxysmal atrial fibrillation The patient has symptomatic, recurrent paroxysmal atrial fibrillation.  Chads2vasc score is 1.  She reports compliance with xarelto without interruption.  Cardiac CT reviewed with her at length today.  We will plan TEE prior to ablation to evaluate IAS further.  Risk, benefits, and alternatives to EP study and radiofrequency ablation for afib were also discussed in detail today. These risks include but are not limited to stroke, bleeding, vascular damage, tamponade, perforation, damage to the esophagus, lungs, and other structures, pulmonary vein stenosis, worsening renal function, and death. The patient understands these risk and wishes to  proceed.   Thompson Grayer MD, [place] 05/26/2020 1:06 PM

## 2020-05-26 NOTE — Anesthesia Procedure Notes (Signed)
Procedure Name: Intubation Date/Time: 05/26/2020 1:29 PM Performed by: Verdie Drown, CRNA Pre-anesthesia Checklist: Patient identified, Emergency Drugs available, Suction available and Patient being monitored Patient Re-evaluated:Patient Re-evaluated prior to induction Oxygen Delivery Method: Circle System Utilized Preoxygenation: Pre-oxygenation with 100% oxygen Induction Type: IV induction Ventilation: Mask ventilation without difficulty Laryngoscope Size: Mac and 3 Tube type: Oral Tube size: 7.0 mm Number of attempts: 1 Airway Equipment and Method: Stylet and Oral airway Placement Confirmation: ETT inserted through vocal cords under direct vision,  positive ETCO2 and breath sounds checked- equal and bilateral Secured at: 21 cm Tube secured with: Tape Dental Injury: Teeth and Oropharynx as per pre-operative assessment

## 2020-05-26 NOTE — Anesthesia Preprocedure Evaluation (Addendum)
Anesthesia Evaluation  Patient identified by MRN, date of birth, ID band Patient awake    Reviewed: Allergy & Precautions, NPO status , Patient's Chart, lab work & pertinent test results  History of Anesthesia Complications Negative for: history of anesthetic complications  Airway Mallampati: I  TM Distance: >3 FB Neck ROM: Full    Dental  (+) Dental Advisory Given, Teeth Intact   Pulmonary neg shortness of breath, neg sleep apnea, neg COPD, neg recent URI,  Covid-19 Nucleic Acid Test Results Lab Results      Component                Value               Date                      SARSCOV2NAA              NEGATIVE            05/23/2020                [place]              NEGATIVE            04/09/2020                [place]              NEGATIVE            09/21/2019              breath sounds clear to auscultation       Cardiovascular + dysrhythmias Atrial Fibrillation  Rhythm:Regular  1. Left ventricular ejection fraction, by estimation, is 70 to 75%. The  left ventricle has hyperdynamic function. The left ventricle has no  regional wall motion abnormalities. Left ventricular diastolic parameters  were normal.  2. Right ventricular systolic function is normal. The right ventricular  size is normal. There is normal pulmonary artery systolic pressure.  3. The mitral valve is normal in structure. Trivial mitral valve  regurgitation.  4. The aortic valve is normal in structure. Aortic valve regurgitation is  not visualized.  5. The inferior vena cava is dilated in size with <50% respiratory  variability, suggesting right atrial pressure of 15 mmHg.    Neuro/Psych PSYCHIATRIC DISORDERS Anxiety negative neurological ROS     GI/Hepatic Neg liver ROS,   Endo/Other  diabetes  Renal/GU negative Renal ROSLab Results      Component                Value               Date                      CREATININE                0.69                04/28/2020                Musculoskeletal   Abdominal   Peds  Hematology  (+) Blood dyscrasia, , xarelto for afib   Anesthesia Other Findings   Reproductive/Obstetrics                            Anesthesia Physical Anesthesia Plan  ASA: II  Anesthesia Plan: General   Post-op  Pain Management:    Induction: Intravenous  PONV Risk Score and Plan: 3 and Ondansetron and Dexamethasone  Airway Management Planned: Oral ETT  Additional Equipment: None  Intra-op Plan:   Post-operative Plan: Extubation in OR  Informed Consent: I have reviewed the patients History and Physical, chart, labs and discussed the procedure including the risks, benefits and alternatives for the proposed anesthesia with the patient or authorized representative who has indicated his/her understanding and acceptance.     Dental advisory given  Plan Discussed with: CRNA  Anesthesia Plan Comments:         Anesthesia Quick Evaluation

## 2020-05-27 ENCOUNTER — Telehealth: Payer: Self-pay

## 2020-05-27 ENCOUNTER — Encounter (HOSPITAL_COMMUNITY): Payer: Self-pay | Admitting: Internal Medicine

## 2020-05-27 NOTE — Telephone Encounter (Signed)
Patient is calling in due to having a 7 pound weight gain in the last 24 hours. She had an ablation done by Dr. Rayann Heman yesterday 11/23.  She reports a distended abdomen and mild puffiness in her feet. She denies SOB or any other symptoms.  Taken to Dr. Johney Frame, DOD who reviewed patients chart and advised for patient to continue to monitor her weight and if her weight increases again tomorrow, call our office back and the on-call provider can call in Lasix for her.    Patient is aware to continue weighing herself daily, call the office with any new signs or symptoms. She verbalized understanding and will call back tomorrow if she continues to gain weight rather than loose.

## 2020-05-27 NOTE — Telephone Encounter (Signed)
Patient reports 7lbs weight gain after ablation procedure. No shortness of breath but notes some abdominal distension. Otherwise stable. Procedure was uncomplicated and TEE with normal BiV function. Recommended continuing to monitor for now and if it does not improve or she develops worsening symptoms, we can re-evaluate and consider PO lasix at that time.  Gwyndolyn Kaufman, MD

## 2020-06-02 NOTE — Anesthesia Postprocedure Evaluation (Signed)
Anesthesia Post Note  Patient: Jasmine Bell  Procedure(s) Performed: ATRIAL FIBRILLATION ABLATION (N/A ) TRANSESOPHAGEAL ECHOCARDIOGRAM (TEE) (N/A )     Patient location during evaluation: PACU Anesthesia Type: General Level of consciousness: awake and alert Pain management: pain level controlled Vital Signs Assessment: post-procedure vital signs reviewed and stable Respiratory status: spontaneous breathing, nonlabored ventilation, respiratory function stable and patient connected to nasal cannula oxygen Cardiovascular status: blood pressure returned to baseline and stable Postop Assessment: no apparent nausea or vomiting Anesthetic complications: no   No complications documented.  Last Vitals:  Vitals:   05/26/20 1705 05/26/20 1720  BP: 101/64 99/78  Pulse: 97 98  Resp: (!) 22 (!) 21  Temp:    SpO2: 99% 100%    Last Pain: There were no vitals filed for this visit.               Sadiq Mccauley

## 2020-06-18 ENCOUNTER — Inpatient Hospital Stay (HOSPITAL_COMMUNITY): Admission: RE | Admit: 2020-06-18 | Payer: 59 | Source: Ambulatory Visit | Admitting: Physician Assistant

## 2020-06-30 ENCOUNTER — Telehealth: Payer: Self-pay | Admitting: Internal Medicine

## 2020-06-30 NOTE — Telephone Encounter (Signed)
1. What dental office are you calling from? Dr. Billy Fischer Adornetto  2. What is your office phone number? [phone]  3. What is your fax number? [phone]   4. What type of procedure is the patient having performed? cleaning  5. What date is procedure scheduled or is the patient there now? Yes patient is there now  (if the patient is at the dentist's office question goes to their cardiologist if he/she is in the office.  If not, question should go to the DOD).   6. What is your question (ex. Antibiotics prior to procedure, holding medication-we need to know how long dentist wants pt to hold med)? Dentist needs to know if the patient needs to be pre-medicated prior to her cleaning

## 2020-06-30 NOTE — Telephone Encounter (Signed)
Fax sent through epic.

## 2020-06-30 NOTE — Telephone Encounter (Signed)
Call received from dentist office.  Pt is at the office for a dental cleaning.  Per DOD-ok to proceed with cleaning.  No premedication needed.  Please fax clearance to dental office-Pt is at the office.

## 2020-07-02 ENCOUNTER — Other Ambulatory Visit: Payer: Self-pay

## 2020-07-02 ENCOUNTER — Ambulatory Visit (HOSPITAL_COMMUNITY)
Admission: RE | Admit: 2020-07-02 | Discharge: 2020-07-02 | Disposition: A | Discharge status: Home / Self Care | Payer: 59 | Source: Ambulatory Visit | Attending: Physician Assistant | Admitting: Physician Assistant

## 2020-07-02 ENCOUNTER — Encounter (HOSPITAL_COMMUNITY): Payer: Self-pay | Admitting: Physician Assistant

## 2020-07-02 VITALS — BP 82/62 | HR 95 | Ht 63.0 in | Wt 130.0 lb

## 2020-07-02 DIAGNOSIS — Z79899 Other long term (current) drug therapy: Secondary | ICD-10-CM | POA: Insufficient documentation

## 2020-07-02 DIAGNOSIS — E041 Nontoxic single thyroid nodule: Secondary | ICD-10-CM | POA: Diagnosis not present

## 2020-07-02 DIAGNOSIS — I480 Paroxysmal atrial fibrillation: Secondary | ICD-10-CM | POA: Insufficient documentation

## 2020-07-02 NOTE — Progress Notes (Signed)
Primary Care Physician: No primary care provider on file. Primary Cardiologist: Dr Anne Fu Primary Electrophysiologist: Dr Johney Frame Referring Physician: Dr Carlyn Reichert is a 34 y.o. 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 The patient was initially diagnosed with atrial fibrillation 01/08/20 after presenting with symptoms of lightheadedness and palpitations. ECG showed afib with RVR. She was given IV diltiazem which converted her to SR. Patient is not on anticoagulation with a CHADS2VASC score of 1. She did have one additional episode of afib at work which converted after taking PRN diltiazem and doing vagal maneuvers in about 10 minutes. She denies significant snoring but does drink a glass of wine daily.   On follow up today, patient is s/p afib ablation with Dr Johney Frame on 05/26/20. Patient reports she has done well post ablation. She denies CP, swallowing, or groin issues. Of note, she did test positive for COVID in the interim. She has not had any further heart racing or palpitations. She denies any bleeding issues on anticoagulation.   Today, she denies symptoms of palpitations, chest pain, shortness of breath, orthopnea, PND, lower extremity edema, dizziness, presyncope, syncope, snoring, daytime somnolence, bleeding, or neurologic sequela. The patient is tolerating medications without difficulties and is otherwise without complaint today.    Atrial Fibrillation Risk Factors:  she does not have symptoms or diagnosis of sleep apnea. she does not have a history of rheumatic fever. she does have a history of alcohol use. The patient does not have a history of early familial atrial fibrillation or other arrhythmias.  she has a BMI of Body mass index is 23.03 kg/m.[name] Filed Weights   07/02/20 1543  Weight: 59 kg    Family History  Problem  Relation Age of Onset  . Depression Father   . Mental illness Father   . Breast cancer Maternal Aunt   . Diabetes Paternal Aunt   . Testicular cancer Maternal Grandfather   . Bipolar disorder Paternal Grandfather   . Heart disease Paternal Grandfather   . Hyperlipidemia Paternal Grandfather   . Cancer Maternal Grandmother   . Stroke Maternal Grandmother   . Mental illness Maternal Grandmother      Atrial Fibrillation Management history:  Previous antiarrhythmic drugs: none Previous cardioversions: none Previous ablations: 05/26/20 CHADS2VASC score: 1 Anticoagulation history: Xarelto   Past Medical History:  Diagnosis Date  . Anxiety   . Gestational diabetes    gestetional  . History of infertility   . Infertility, female   . Newborn product of IVF pregnancy   . Paroxysmal atrial fibrillation (HCC)    A-fib  . SVD (spontaneous vaginal delivery)    x 1   Past Surgical History:  Procedure Laterality Date  . aerolar cyst on breast removed    . ATRIAL FIBRILLATION ABLATION N/A 05/26/2020   Procedure: ATRIAL FIBRILLATION ABLATION;  Surgeon: Hillis Range, MD;  Location: [facility] INVASIVE CV LAB;  Service: Cardiovascular;  Laterality: N/A;  . BREAST SURGERY     areolar cyst removal  . CESAREAN SECTION N/A 09/23/2019   Procedure: CESAREAN SECTION;  Surgeon: Huel Cote, MD;  Location: [facility] LD ORS;  Service: Obstetrics;  Laterality: N/A;  . DILATION AND CURETTAGE OF UTERUS    . DILATION AND EVACUATION  04/12/2016   Procedure: DILATATION AND EVACUATION;  Surgeon: Lavina Hamman, MD;  Location: [facility] ORS;  Service: Gynecology;;  . Joya Gaskins  AND EVACUATION N/A 05/17/2017   Procedure: DILATATION AND EVACUATION;  Surgeon: Huel Cote, MD;  Location: [facility] ORS;  Service: Gynecology;  Laterality: N/A;  . DILATION AND EVACUATION N/A 04/10/2018   Procedure: DILATATION AND EVACUATION;  Surgeon: Huel Cote, MD;  Location: [facility] ORS;  Service: Gynecology;  Laterality: N/A;  . TEE  WITHOUT CARDIOVERSION N/A 05/26/2020   Procedure: TRANSESOPHAGEAL ECHOCARDIOGRAM (TEE);  Surgeon: Hillis Range, MD;  Location: [facility] INVASIVE CV LAB;  Service: Cardiovascular;  Laterality: N/A;  . THYROID LOBECTOMY Left 04/13/2020   Procedure: THYROID LOBECTOMY;  Surgeon: Darnell Level, MD;  Location: [facility] ORS;  Service: General;  Laterality: Left;  . WISDOM TOOTH EXTRACTION      Current Outpatient Medications  Medication Sig Dispense Refill  . diltiazem (CARDIZEM) 30 MG tablet Take 1 tablet (30 mg total) by mouth every 6 (six) hours as needed (for elevated heart rate). 30 tablet 3  . pantoprazole (PROTONIX) 40 MG tablet Take 1 tablet (40 mg total) by mouth daily. 45 tablet 0  . rivaroxaban (XARELTO) 20 MG TABS tablet Take 1 tablet (20 mg total) by mouth daily with supper. 30 tablet 11  . sertraline (ZOLOFT) 50 MG tablet Take 1 tablet (50 mg total) by mouth daily. 90 tablet 3   No current facility-administered medications for this encounter.    No Known Allergies  Social History   Socioeconomic History  . Marital status: Married    Spouse name: Not on file  . Number of children: 1  . Years of education: Not on file  . Highest education level: Master's degree (e.g., MA, MS, MEng, MEd, MSW, MBA)  Occupational History  . Occupation: Scientist, clinical (histocompatibility and immunogenetics)  Tobacco Use  . Smoking status: Never Smoker  . Smokeless tobacco: Never Used  Vaping Use  . Vaping Use: Never used  Substance and Sexual Activity  . Alcohol use: Yes    Alcohol/week: 1.0 standard drink    Types: 1 Glasses of wine per week    Comment: 1 glass of wine per day  . Drug use: No  . Sexual activity: Yes    Partners: Male    Birth control/protection: None    Comment: approx [redacted] wks gestation  Other Topics Concern  . Not on file  Social History Narrative   Pt is from [place]. Lives in [place]. Works as Scientist, clinical (histocompatibility and immunogenetics). Lives at home with husband   Two children   Social Determinants of Health   Financial Resource Strain: Not on file   Food Insecurity: Not on file  Transportation Needs: Not on file  Physical Activity: Not on file  Stress: Not on file  Social Connections: Not on file  Intimate Partner Violence: Not on file     ROS- All systems are reviewed and negative except as per the HPI above.  Physical Exam: Vitals:   07/02/20 1543  BP: (!) 82/62  Pulse: 95  Weight: 59 kg  Height: 5\' 3"  (1.6 m)    GEN- The patient is well appearing, alert and oriented x 3 today.   HEENT-head normocephalic, atraumatic, sclera clear, conjunctiva pink, hearing intact, trachea midline. Lungs- Clear to ausculation bilaterally, normal work of breathing Heart- Regular rate and rhythm, no murmurs, rubs or gallops  GI- soft, NT, ND, + BS Extremities- no clubbing, cyanosis, or edema MS- no significant deformity or atrophy Skin- no rash or lesion Psych- euthymic mood, full affect Neuro- strength and sensation are intact   Wt Readings from Last 3 Encounters:  07/02/20 59 kg  04/22/20 57.2  kg  04/13/20 54.9 kg    EKG today demonstrates  SR  Vent. rate 95 BPM PR interval 138 ms QRS duration 82 ms QT/QTc 376/472 ms  Echo 01/27/20 demonstrated  1. Left ventricular ejection fraction, by estimation, is 70 to 75%. The  left ventricle has hyperdynamic function. The left ventricle has no  regional wall motion abnormalities. Left ventricular diastolic parameters  were normal.  2. Right ventricular systolic function is normal. The right ventricular  size is normal. There is normal pulmonary artery systolic pressure.  3. The mitral valve is normal in structure. Trivial mitral valve  regurgitation.  4. The aortic valve is normal in structure. Aortic valve regurgitation is  not visualized.  5. The inferior vena cava is dilated in size with <50% respiratory  variability, suggesting right atrial pressure of 15 mmHg.   Epic records are reviewed at length today  CHA2DS2-VASc Score = 1  The patient's score is based  upon: CHF History: No HTN History: No Diabetes History: No Stroke History: No Vascular Disease History: No      ASSESSMENT AND PLAN: 1. Paroxysmal Atrial Fibrillation (ICD10:  I48.0) The patient's CHA2DS2-VASc score is 1, indicating a 0.6% annual risk of stroke.   S/p afib ablation with Dr Rayann Heman 05/26/20 Patient appears to be maintaining SR. Continue Xarelto 20 mg daily for at least 3 months post ablation with no missed doses. Continue diltiazem 30 mg PRN q 4 hours for heart racing.  2. Thyroid nodule S/p hemithyroidectomy 04/13/20.   Follow up with Dr Rayann Heman as scheduled.    [facility] [address] [place], [place] [address] [phone] 07/02/2020 4:06 PM

## 2020-07-31 ENCOUNTER — Encounter: Payer: Self-pay | Admitting: Internal Medicine

## 2020-08-21 ENCOUNTER — Ambulatory Visit: Payer: 59 | Admitting: Internal Medicine

## 2020-08-31 ENCOUNTER — Other Ambulatory Visit: Payer: Self-pay

## 2020-08-31 ENCOUNTER — Ambulatory Visit (INDEPENDENT_AMBULATORY_CARE_PROVIDER_SITE_OTHER): Payer: 59 | Admitting: Internal Medicine

## 2020-08-31 ENCOUNTER — Encounter: Payer: Self-pay | Admitting: Internal Medicine

## 2020-08-31 VITALS — BP 112/78 | HR 95 | Ht 63.0 in | Wt 131.0 lb

## 2020-08-31 DIAGNOSIS — Q211 Atrial septal defect, unspecified: Secondary | ICD-10-CM

## 2020-08-31 DIAGNOSIS — I480 Paroxysmal atrial fibrillation: Secondary | ICD-10-CM | POA: Diagnosis not present

## 2020-08-31 NOTE — Patient Instructions (Addendum)
Medication Instructions:  Stop you Xarelto  Your physician recommends that you continue on your current medications as directed. Please refer to the Current Medication list given to you today.  Labwork: None ordered.  Testing/Procedures: None ordered.  Follow-Up: Your physician wants you to follow-up in: 03/01/21 at 4 pm with Dr. Rayann Heman.   Any Other Special Instructions Will Be Listed Below (If Applicable).  If you need a refill on your cardiac medications before your next appointment, please call your pharmacy.

## 2020-08-31 NOTE — Progress Notes (Signed)
PCP: Patient, No Pcp Per   Jasmine Bell is a 35 y.o. female who presents today for routine electrophysiology followup.  Since his recent afib ablation, the patient reports doing very well.  she denies procedure related complications and is pleased with the results of the procedure.  Today, she denies symptoms of palpitations, chest pain, shortness of breath,  lower extremity edema, dizziness, presyncope, or syncope.  The patient is otherwise without complaint today.   Past Medical History:  Diagnosis Date  . Anxiety   . Gestational diabetes    gestetional  . History of infertility   . Infertility, female   . Newborn product of IVF pregnancy   . Paroxysmal atrial fibrillation (HCC)    A-fib  . SVD (spontaneous vaginal delivery)    x 1   Past Surgical History:  Procedure Laterality Date  . aerolar cyst on breast removed    . ATRIAL FIBRILLATION ABLATION N/A 05/26/2020   Procedure: ATRIAL FIBRILLATION ABLATION;  Surgeon: Thompson Grayer, MD;  Location: [facility] CV LAB;  Service: Cardiovascular;  Laterality: N/A;  . BREAST SURGERY     areolar cyst removal  . CESAREAN SECTION N/A 09/23/2019   Procedure: CESAREAN SECTION;  Surgeon: Paula Compton, MD;  Location: [facility] LD ORS;  Service: Obstetrics;  Laterality: N/A;  . DILATION AND CURETTAGE OF UTERUS    . DILATION AND EVACUATION  04/12/2016   Procedure: DILATATION AND EVACUATION;  Surgeon: Cheri Fowler, MD;  Location: [facility] ORS;  Service: Gynecology;;  . Brigitte Pulse AND EVACUATION N/A 05/17/2017   Procedure: DILATATION AND EVACUATION;  Surgeon: Paula Compton, MD;  Location: [facility] ORS;  Service: Gynecology;  Laterality: N/A;  . DILATION AND EVACUATION N/A 04/10/2018   Procedure: DILATATION AND EVACUATION;  Surgeon: Paula Compton, MD;  Location: [facility] ORS;  Service: Gynecology;  Laterality: N/A;  . TEE WITHOUT CARDIOVERSION N/A 05/26/2020   Procedure: TRANSESOPHAGEAL ECHOCARDIOGRAM (TEE);  Surgeon: Thompson Grayer, MD;  Location: [facility] CV LAB;  Service: Cardiovascular;  Laterality: N/A;  . THYROID LOBECTOMY Left 04/13/2020   Procedure: THYROID LOBECTOMY;  Surgeon: Armandina Gemma, MD;  Location: [facility] ORS;  Service: General;  Laterality: Left;  . WISDOM TOOTH EXTRACTION      ROS- all systems are personally reviewed and negatives except as per HPI above  Current Outpatient Medications  Medication Sig Dispense Refill  . diltiazem (CARDIZEM) 30 MG tablet Take 1 tablet (30 mg total) by mouth every 6 (six) hours as needed (for elevated heart rate). 30 tablet 3  . pantoprazole (PROTONIX) 40 MG tablet Take 1 tablet (40 mg total) by mouth daily. 45 tablet 0  . rivaroxaban (XARELTO) 20 MG TABS tablet Take 1 tablet (20 mg total) by mouth daily with supper. 30 tablet 11  . sertraline (ZOLOFT) 50 MG tablet Take 1 tablet (50 mg total) by mouth daily. 90 tablet 3   No current facility-administered medications for this visit.    Physical Exam: There were no vitals filed for this visit.  GEN- The patient is well appearing, alert and oriented x 3 today.   Head- normocephalic, atraumatic Eyes-  Sclera clear, conjunctiva pink Ears- hearing intact Oropharynx- clear Lungs- Clear to ausculation bilaterally, normal work of breathing Heart- Regular rate and rhythm, no murmurs, rubs or gallops, PMI not laterally displaced GI- soft, NT, ND, + BS Extremities- no clubbing, cyanosis, or edema  EKG tracing ordered today is personally reviewed and shows sinus rhythm  Assessment and Plan:  1. Paroxysmal atrial fibrillation Doing well  s/p ablation chads2vasc score is 1.  Stop xarelto at this time  2. ASD Noted on TEE Conservative management advised Repeat echo every few years  Return to see me in 6 months  Thompson Grayer MD, [facility] 08/31/2020 3:45 PM

## 2020-10-30 ENCOUNTER — Encounter: Payer: Self-pay | Admitting: Internal Medicine

## 2020-11-09 NOTE — Telephone Encounter (Signed)
Patient has scheduled a follow up appointment in 02/2021, but is still requesting to have her labs checked in the next month if possible.  Call back # [phone] or MyChart message OK

## 2021-02-25 ENCOUNTER — Encounter: Payer: Self-pay | Admitting: Internal Medicine

## 2021-02-25 ENCOUNTER — Ambulatory Visit: Payer: No Typology Code available for payment source | Admitting: Internal Medicine

## 2021-02-25 ENCOUNTER — Other Ambulatory Visit: Payer: Self-pay

## 2021-02-25 VITALS — BP 110/76 | HR 93 | Ht 63.0 in | Wt 130.8 lb

## 2021-02-25 DIAGNOSIS — Z9889 Other specified postprocedural states: Secondary | ICD-10-CM

## 2021-02-25 DIAGNOSIS — Z8639 Personal history of other endocrine, nutritional and metabolic disease: Secondary | ICD-10-CM | POA: Diagnosis not present

## 2021-02-25 DIAGNOSIS — R7302 Impaired glucose tolerance (oral): Secondary | ICD-10-CM

## 2021-02-25 LAB — T4, FREE: Free T4: 0.69 ng/dL (ref 0.60–1.60)

## 2021-02-25 LAB — T3, FREE: T3, Free: 3.1 pg/mL (ref 2.3–4.2)

## 2021-02-25 LAB — TSH: TSH: 3.14 u[IU]/mL (ref 0.35–5.50)

## 2021-02-25 LAB — HEMOGLOBIN A1C: Hgb A1c MFr Bld: 5.1 % (ref 4.6–6.5)

## 2021-02-25 NOTE — Progress Notes (Signed)
Patient ID: Jasmine Bell, female   DOB: 07-Apr-1986, 35 y.o.   MRN: [id number]   This visit occurred during the SARS-CoV-2 public health emergency.  Safety protocols were in place, including screening questions prior to the visit, additional usage of staff PPE, and extensive cleaning of exam room while observing appropriate contact time as indicated for disinfecting solutions.   HPI: Jasmine Bell is a 35 y.o.-year-old female, returning for follow-up for history of GDM, with impaired glucose tolerance and history of a left thyroid nodule, now s/o left thyroid lobectomy.  Last visit 1 year ago.  Interim history: Since last visit, she had left thyroid lobectomy 04/13/2020 by Dr. Harlow Asa -final pathology benign. She is not on levothyroxine yet. She had a cardiac ablation in 06/2020 >> some skipped beats, no A fib episodes. Since last visit, several months ago, she gained weight, had some fatigue, not sleeping well >> she  started to change diet and cut down on wine >> lost 6 lbs in last 2 months and  sleeping better >> feels much better, with good energy. Stress at work is a little better.  History of left thyroid nodule: -Incidentally found during investigation for tachycardia: 01/08/2020: Chest CT angio: Low attenuating lesion in left lobe of the thyroid gland measures 0.9 x 0.9 x 1.7 cm.  At that time, patient not in touch with me, and I ordered a thyroid ultrasound: 01/16/2020: Thyroid U/S: Parenchymal Echotexture: Mildly heterogenous Isthmus: 0.3 cm Right lobe: 5.5 x 1.4 x 1.4 cm Left lobe: 5 x 1.4 x 1.6 cm _________________________________________________________   Estimated total number of nodules >/= 1 cm: 1 _________________________________________________________   Nodule # 2: Location: Left; Mid Maximum size: 1.8 cm; Other 2 dimensions: 1.1 x 1 cm Composition: solid/almost completely solid (2) Echogenicity: hypoechoic (2) ACR TI-RADS recommendations: **Given size (>/=  1.5 cm) and appearance, fine needle aspiration of this moderately suspicious nodule should be considered based on TI-RADS criteria. _________________________________________________________   There is a small 0.7 cm spongiform thyroid nodule in the isthmus that does not meet criteria for FNA or follow-up.   IMPRESSION: 1. Mildly heterogeneous, mildly enlarged thyroid gland. 2. There is a 1.8 cm TR4 thyroid nodule in the left mid thyroid gland. Fine-needle aspiration is recommended for this thyroid nodule.      We biopsied the nodule, with inconclusive results: FNA (01/23/2020):   - Atypia of undetermined significance (Bethesda category III)  SPECIMEN ADEQUACY:  Satisfactory for evaluation   Afirma: Suspicious  Thyroid left lobectomy (04/13/2020): Benign pathology with marked lymphocytic thyroiditis  Pt denies: - feeling nodules in neck - hoarseness - dysphagia - choking - SOB with lying down  No family history of thyroid disease.  No FH of thyroid cancer. No h/o radiation tx to head or neck.  No herbal supplements. No Biotin use. No recent steroids use.   Reviewed her TFTs: 07/2020: TSH 5.640 Lab Results  Component Value Date   TSH 2.066 01/08/2020   TSH 1.550 01/12/2018   Impaired glucose tolerance:  Before last visit, she switched to a more plant-based diet.  She had eggs and yoghurt only few times, and mostly eats fruit, vegetable, seeds, legumes.  She lost 9 pounds before last visit.  Since last visit, she gained some weight back when she was able to lose them again.  Reviewed history: Pt has a history of infertility and had IVF in 2020.  She gave birth on 09/23/2019 (C-section) to an 8 pound 10 ounce healthy son.  She  is on OCPs.  During the pregnancy, she had gestational diabetes with 2 abnormal OGTT tests.  Results reviewed PER OB/GYN records:  07/09/2019: 50 g glucose solution: Glucose 174 (65-1 39)  07/17/2019: 100 g glucose solution: Fasting: Glucose 70  (65-94) 1-hour: Glucose 145 (65-179) 2-hour: Glucose 138 (65-154) 3-hour: Glucose 148 (55-139)  08/20/2019: 100 g glucose solution: Fasting: Glucose 80 (65-94) 1-hour: Glucose 213 (65-179) 2-hour: Glucose 185 (65-154) 3-hour: Glucose 115 (55-139)  6 weeks postpartum:  - abnormal (abnormal) - 150 (55-139)  HbA1c at last visit was in the normal range: Lab Results  Component Value Date   HGBA1C 5.1 03/05/2020   HGBA1C 5.1 12/06/2019   She is not on any medications for her hyperglycemia.  She is not checking sugars.  She denies increased urination, thirst, hunger, also denies blurry vision and unintentional weight loss.  -No CKD, last BUN/creatinine:  Lab Results  Component Value Date   BUN 15 04/28/2020   BUN 21 (H) 04/06/2020   CREATININE 0.69 04/28/2020   CREATININE 0.68 04/06/2020   -No HL; last set of lipids: Lab Results  Component Value Date   CHOL 173 01/12/2018   HDL 66 01/12/2018   LDLCALC 95 01/12/2018   TRIG 60 01/12/2018   CHOLHDL 2.6 01/12/2018   Pt has FH of DM in P uncle, PGM. Father and PGF with prediabetes.  She has 74 boys: 74.36 years old and almost 36 years old.   She has a history of 3 miscarriages/retained placental parts in 2017, 18, 19.  She is a Immunologist in anesthesia.    ROS: Constitutional: + weight gain/+ weight loss, + improved fatigue, no subjective hyperthermia, no subjective hypothermia Eyes: no blurry vision, no xerophthalmia ENT: no sore throat, + see HPI Cardiovascular: no CP/no SOB/+ palpitations/no leg swelling Respiratory: no cough/no SOB/no wheezing Gastrointestinal: no N/no V/no D/no C/no acid reflux Musculoskeletal: no muscle aches/no joint aches Skin: no rashes, no hair loss Neurological: no tremors/no numbness/no tingling/no dizziness  I reviewed pt's medications, allergies, PMH, social hx, family hx, and changes were documented in the history of present illness. Otherwise, unchanged from my initial visit note.  Past Medical  History:  Diagnosis Date   Anxiety    Gestational diabetes    gestetional   History of infertility    Infertility, female    Newborn product of IVF pregnancy    Paroxysmal atrial fibrillation (HCC)    A-fib   SVD (spontaneous vaginal delivery)    x 1   Past Surgical History:  Procedure Laterality Date   aerolar cyst on breast removed     ATRIAL FIBRILLATION ABLATION N/A 05/26/2020   Procedure: ATRIAL FIBRILLATION ABLATION;  Surgeon: Thompson Grayer, MD;  Location: [facility];  Service: Cardiovascular;  Laterality: N/A;   BREAST SURGERY     areolar cyst removal   CESAREAN SECTION N/A 09/23/2019   Procedure: CESAREAN SECTION;  Surgeon: Paula Compton, MD;  Location: [facility];  Service: Obstetrics;  Laterality: N/A;   DILATION AND CURETTAGE OF UTERUS     DILATION AND EVACUATION  04/12/2016   Procedure: DILATATION AND EVACUATION;  Surgeon: Cheri Fowler, MD;  Location: [facility];  Service: Gynecology;;   DILATION AND EVACUATION N/A 05/17/2017   Procedure: DILATATION AND EVACUATION;  Surgeon: Paula Compton, MD;  Location: [facility];  Service: Gynecology;  Laterality: N/A;   DILATION AND EVACUATION N/A 04/10/2018   Procedure: DILATATION AND EVACUATION;  Surgeon: Paula Compton, MD;  Location: [facility];  Service: Gynecology;  Laterality: N/A;   TEE WITHOUT CARDIOVERSION N/A 05/26/2020   Procedure: TRANSESOPHAGEAL ECHOCARDIOGRAM (TEE);  Surgeon: Thompson Grayer, MD;  Location: [facility];  Service: Cardiovascular;  Laterality: N/A;   THYROID LOBECTOMY Left 04/13/2020   Procedure: THYROID LOBECTOMY;  Surgeon: Armandina Gemma, MD;  Location: [facility];  Service: General;  Laterality: Left;   WISDOM TOOTH EXTRACTION     Social History   Socioeconomic History   Marital status: Married    Spouse name: Not on file   Number of children: 1   Years of education: Not on file   Highest education level: Master's degree (e.g., MA, MS, MEng, MEd, MSW, MBA)  Occupational History    Occupation: CRNA  Tobacco Use   Smoking status: Never   Smokeless tobacco: Never  Vaping Use   Vaping Use: Never used  Substance and Sexual Activity   Alcohol use: Yes    Alcohol/week: 1.0 standard drink    Types: 1 Glasses of wine per week    Comment: 1 glass of wine per day   Drug use: No   Sexual activity: Yes    Partners: Male    Birth control/protection: None    Comment: approx [redacted] wks gestation  Other Topics Concern   Not on file  Social History Narrative   Pt is from [place]. Lives in [place]. Works as Immunologist. Lives at home with husband   Two children   Social Determinants of Health   Financial Resource Strain: Not on file  Food Insecurity: Not on file  Transportation Needs: Not on file  Physical Activity: Not on file  Stress: Not on file  Social Connections: Not on file  Intimate Partner Violence: Not on file   Current Outpatient Medications on File Prior to Visit  Medication Sig Dispense Refill   diltiazem (CARDIZEM) 30 MG tablet Take 1 tablet (30 mg total) by mouth every 6 (six) hours as needed (for elevated heart rate). 30 tablet 3   sertraline (ZOLOFT) 50 MG tablet Take 1 tablet (50 mg total) by mouth daily. 90 tablet 3   No current facility-administered medications on file prior to visit.   No Known Allergies Family History  Problem Relation Age of Onset   Depression Father    Mental illness Father    Breast cancer Maternal Aunt    Diabetes Paternal Aunt    Testicular cancer Maternal Grandfather    Bipolar disorder Paternal Grandfather    Heart disease Paternal Grandfather    Hyperlipidemia Paternal Grandfather    Cancer Maternal Grandmother    Stroke Maternal Grandmother    Mental illness Maternal Grandmother    PE: BP 110/76 (BP Location: Right Arm, Patient Position: Sitting, Cuff Size: Normal)   Pulse 93   Ht '5\' 3"'$  (1.6 m)   Wt 130 lb 12.8 oz (59.3 kg)   SpO2 99%   BMI 23.17 kg/m  Wt Readings from Last 3 Encounters:  02/25/21 130 lb  12.8 oz (59.3 kg)  08/31/20 131 lb (59.4 kg)  07/02/20 130 lb (59 kg)   Constitutional: normal weight, in NAD Eyes: PERRLA, EOMI, no exophthalmos ENT: moist mucous membranes, no neck masses palpated, cervical scar healed, no cervical lymphadenopathy Cardiovascular: RRR, No MRG Respiratory: CTA B Gastrointestinal: abdomen soft, NT, ND, BS+ Musculoskeletal: no deformities, strength intact in all 4 Skin: moist, warm, no rashes Neurological: no tremor with outstretched hands, DTR normal in all 4  ASSESSMENT: 1.  History of left thyroid nodule  -Now status post left thyroid  lobectomy  2.  History of impaired glucose tolerance - History of gestational diabetes  PLAN:  1.  History of left thyroid nodule  -Patient with a history of a left thyroid nodule incidentally seen on a CT angio performed for tachycardia and atrial fibrillation.  She contacted me after the results of the CT scan returned and I ordered a thyroid ultrasound.  This showed a heterogeneous thyroid with a mildly suspicious left thyroid nodule, measuring 1.8 cm in the largest dimension.  We biopsied the nodule and the results were inconclusive.  The Afirma molecular marker returned suspicious.  At last visit, we reviewed together the results and also the uncertainty in the affirmative prognosis (50% risk of cancer).  She agreed to have a left lobectomy at that time.  She had this on 04/13/2020.  The final pathology was benign. -We did discuss that approximately 25% of patients may develop hypothyroidism after hemithyroidectomy -On pathology, there was marked lymphocytic thyroiditis, indicating increased inflammation, which predisposes her to hypothyroidism -She had a TSH of 5.64 checked in 07/2020 and I advised her to come back for labs to recheck this, however, due to misunderstanding with our office, she did not return. -At this visit, we will check her TFTs and discussed about how to take levothyroxine correctly in case she  needs to start: -I will see her back in 6 months, but possibly sooner for labs.  2.  Patient with history of gestational diabetes during her most recent pregnancy with abnormal OGTT x3 during the last pregnancy and 1 abnormal OGTT obtained after giving birth -At last visit we checked an HbA1c which returned at 5.1%, excellent.  I explained that this is not in the range for prediabetes and we can manage her without medications. -At last visit, we discussed about the concept of insulin resistance and I made specific suggestions about how to improve this. I suggested a more whole food plant-based diet and also recommended exercise to decrease her insulin resistance. Before last visit, she changed her diet and became more plant-based.  She lost 9 pounds, which she was able to maintain. -She denies blurry vision, increased urination, significant weight changes -At last visit HbA1c was 5.1%, stable, excellent -We will repeat her HbA1c level today  Component     Latest Ref Rng & Units 02/25/2021  TSH     0.35 - 5.50 uIU/mL 3.14  Hemoglobin A1C     4.6 - 6.5 % 5.1  T4,Free(Direct)     0.60 - 1.60 ng/dL 0.69  Triiodothyronine,Free,Serum     2.3 - 4.2 pg/mL 3.1  Thyroid tests are normal.  HbA1c stable.  Philemon Kingdom, MD PhD [facility]

## 2021-02-25 NOTE — Patient Instructions (Signed)
Please stop at the lab.  Please come back for a follow-up appointment in 6 months.  

## 2021-03-01 ENCOUNTER — Other Ambulatory Visit: Payer: Self-pay

## 2021-03-01 ENCOUNTER — Ambulatory Visit: Payer: No Typology Code available for payment source | Admitting: Internal Medicine

## 2021-03-01 VITALS — BP 118/62 | HR 74 | Ht 63.0 in | Wt 130.4 lb

## 2021-03-01 DIAGNOSIS — Q211 Atrial septal defect, unspecified: Secondary | ICD-10-CM

## 2021-03-01 DIAGNOSIS — I480 Paroxysmal atrial fibrillation: Secondary | ICD-10-CM | POA: Diagnosis not present

## 2021-03-01 NOTE — Patient Instructions (Addendum)
Medication Instructions:  Your physician recommends that you continue on your current medications as directed. Please refer to the Current Medication list given to you today.  Labwork: None ordered.  Testing/Procedures: None ordered.  Follow-Up: Your physician wants you to follow-up in: 12 months with  Thompson Grayer, MD    You will receive a reminder letter in the mail two months in advance. If you don't receive a letter, please call our office to schedule the follow-up appointment.  Any Other Special Instructions Will Be Listed Below (If Applicable).  If you need a refill on your cardiac medications before your next appointment, please call your pharmacy.

## 2021-03-01 NOTE — Progress Notes (Signed)
PCP: Patient, No Pcp Per (Inactive)   Primary EP: Dr Rayann Heman  Merdis Delay is a 35 y.o. female who presents today for routine electrophysiology followup.  Since last being seen in our clinic, the patient reports doing very well.  Today, she denies symptoms of palpitations, chest pain, shortness of breath,  lower extremity edema, dizziness, presyncope, or syncope.  The patient is otherwise without complaint today.   Past Medical History:  Diagnosis Date   Anxiety    Gestational diabetes    gestetional   History of infertility    Infertility, female    Newborn product of IVF pregnancy    Paroxysmal atrial fibrillation (HCC)    A-fib   SVD (spontaneous vaginal delivery)    x 1   Past Surgical History:  Procedure Laterality Date   aerolar cyst on breast removed     ATRIAL FIBRILLATION ABLATION N/A 05/26/2020   Procedure: ATRIAL FIBRILLATION ABLATION;  Surgeon: Thompson Grayer, MD;  Location: [facility];  Service: Cardiovascular;  Laterality: N/A;   BREAST SURGERY     areolar cyst removal   CESAREAN SECTION N/A 09/23/2019   Procedure: CESAREAN SECTION;  Surgeon: Paula Compton, MD;  Location: [facility];  Service: Obstetrics;  Laterality: N/A;   DILATION AND CURETTAGE OF UTERUS     DILATION AND EVACUATION  04/12/2016   Procedure: DILATATION AND EVACUATION;  Surgeon: Cheri Fowler, MD;  Location: [facility];  Service: Gynecology;;   DILATION AND EVACUATION N/A 05/17/2017   Procedure: DILATATION AND EVACUATION;  Surgeon: Paula Compton, MD;  Location: [facility];  Service: Gynecology;  Laterality: N/A;   DILATION AND EVACUATION N/A 04/10/2018   Procedure: DILATATION AND EVACUATION;  Surgeon: Paula Compton, MD;  Location: [facility];  Service: Gynecology;  Laterality: N/A;   TEE WITHOUT CARDIOVERSION N/A 05/26/2020   Procedure: TRANSESOPHAGEAL ECHOCARDIOGRAM (TEE);  Surgeon: Thompson Grayer, MD;  Location: [facility];  Service: Cardiovascular;  Laterality: N/A;   THYROID  LOBECTOMY Left 04/13/2020   Procedure: THYROID LOBECTOMY;  Surgeon: Armandina Gemma, MD;  Location: [facility];  Service: General;  Laterality: Left;   WISDOM TOOTH EXTRACTION      ROS- all systems are reviewed and negatives except as per HPI above  Current Outpatient Medications  Medication Sig Dispense Refill   sertraline (ZOLOFT) 50 MG tablet Take 1 tablet (50 mg total) by mouth daily. 90 tablet 3   diltiazem (CARDIZEM) 30 MG tablet Take 1 tablet (30 mg total) by mouth every 6 (six) hours as needed (for elevated heart rate). 30 tablet 3   No current facility-administered medications for this visit.    Physical Exam: Vitals:   03/01/21 1615  BP: 118/62  Pulse: 74  SpO2: 98%  Weight: 130 lb 6.4 oz (59.1 kg)  Height: '5\' 3"'$  (1.6 m)    GEN- The patient is well appearing, alert and oriented x 3 today.   Head- normocephalic, atraumatic Eyes-  Sclera clear, conjunctiva pink Ears- hearing intact Oropharynx- clear Lungs- Clear to ausculation bilaterally, normal work of breathing Heart- Regular rate and rhythm, no murmurs, rubs or gallops, PMI not laterally displaced GI- soft, NT, ND, + BS Extremities- no clubbing, cyanosis, or edema  Wt Readings from Last 3 Encounters:  03/01/21 130 lb 6.4 oz (59.1 kg)  02/25/21 130 lb 12.8 oz (59.3 kg)  08/31/20 131 lb (59.4 kg)    EKG tracing ordered today is personally reviewed and shows sinus  Assessment and Plan:  Paroxysmal atrial fibrillation  Well controlled post ablation 05/26/20 Chads2vasc score is 1. She does not require [place].  2. ASD Noted on TEE Repeat 2d echo on return in a year  Return to see me in a year  Thompson Grayer MD, [facility] 03/01/2021 4:15 PM

## 2021-03-25 ENCOUNTER — Other Ambulatory Visit: Payer: Self-pay

## 2021-03-25 ENCOUNTER — Encounter: Payer: Self-pay | Admitting: Registered Nurse

## 2021-03-25 ENCOUNTER — Ambulatory Visit: Payer: No Typology Code available for payment source | Admitting: Registered Nurse

## 2021-03-25 DIAGNOSIS — F419 Anxiety disorder, unspecified: Secondary | ICD-10-CM | POA: Diagnosis not present

## 2021-03-25 MED ORDER — SERTRALINE HCL 50 MG PO TABS: 50.0000 mg | ORAL_TABLET | Freq: Every day | ORAL | 3 refills | Status: DC

## 2021-03-25 MED ORDER — SERTRALINE HCL 25 MG PO TABS: 25.0000 mg | ORAL_TABLET | Freq: Every day | ORAL | 3 refills | Status: DC

## 2021-03-25 NOTE — Progress Notes (Signed)
Established Patient Office Visit  Subjective:  Patient ID: Jasmine Bell, female    DOB: October 16, 1985  Age: 35 y.o. MRN: [id number]  CC:  Chief Complaint  Patient presents with   Medication Refill    Patient states she is here for an medication refill.    HPI REIGHN KAPLAN presents for med refill  Sertraline - 50mg  po qd Run out about two weeks ago Notices anxiety worsening Had been good effect, though perhaps incomplete previously.  Notes thyroid dysfunction is stable. Managed with Dr. Renne Crigler.  Notes cardiac function stable. S/p ablation. Doing well so far in 2022.   No further concerns.   Past Medical History:  Diagnosis Date   Anxiety    Gestational diabetes    gestetional   History of infertility    Infertility, female    Newborn product of IVF pregnancy    Paroxysmal atrial fibrillation (HCC)    A-fib   SVD (spontaneous vaginal delivery)    x 1    Past Surgical History:  Procedure Laterality Date   aerolar cyst on breast removed     ATRIAL FIBRILLATION ABLATION N/A 05/26/2020   Procedure: ATRIAL FIBRILLATION ABLATION;  Surgeon: Thompson Grayer, MD;  Location: [facility];  Service: Cardiovascular;  Laterality: N/A;   BREAST SURGERY     areolar cyst removal   CESAREAN SECTION N/A 09/23/2019   Procedure: CESAREAN SECTION;  Surgeon: Paula Compton, MD;  Location: [facility];  Service: Obstetrics;  Laterality: N/A;   DILATION AND CURETTAGE OF UTERUS     DILATION AND EVACUATION  04/12/2016   Procedure: DILATATION AND EVACUATION;  Surgeon: Cheri Fowler, MD;  Location: [facility];  Service: Gynecology;;   DILATION AND EVACUATION N/A 05/17/2017   Procedure: DILATATION AND EVACUATION;  Surgeon: Paula Compton, MD;  Location: [facility];  Service: Gynecology;  Laterality: N/A;   DILATION AND EVACUATION N/A 04/10/2018   Procedure: DILATATION AND EVACUATION;  Surgeon: Paula Compton, MD;  Location: [facility];  Service: Gynecology;  Laterality: N/A;   TEE  WITHOUT CARDIOVERSION N/A 05/26/2020   Procedure: TRANSESOPHAGEAL ECHOCARDIOGRAM (TEE);  Surgeon: Thompson Grayer, MD;  Location: [facility];  Service: Cardiovascular;  Laterality: N/A;   THYROID LOBECTOMY Left 04/13/2020   Procedure: THYROID LOBECTOMY;  Surgeon: Armandina Gemma, MD;  Location: [facility];  Service: General;  Laterality: Left;   WISDOM TOOTH EXTRACTION      Family History  Problem Relation Age of Onset   Depression Father    Mental illness Father    Breast cancer Maternal Aunt    Diabetes Paternal Aunt    Testicular cancer Maternal Grandfather    Bipolar disorder Paternal Grandfather    Heart disease Paternal Grandfather    Hyperlipidemia Paternal Grandfather    Cancer Maternal Grandmother    Stroke Maternal Grandmother    Mental illness Maternal Grandmother     Social History   Socioeconomic History   Marital status: Married    Spouse name: Not on file   Number of children: 1   Years of education: Not on file   Highest education level: Master's degree (e.g., MA, MS, MEng, MEd, MSW, MBA)  Occupational History   Occupation: CRNA  Tobacco Use   Smoking status: Never   Smokeless tobacco: Never  Vaping Use   Vaping Use: Never used  Substance and Sexual Activity   Alcohol use: Yes    Alcohol/week: 1.0 standard drink    Types: 1 Glasses of wine per week  Comment: 1 glass of wine per day   Drug use: No   Sexual activity: Yes    Partners: Male    Birth control/protection: None    Comment: approx [redacted] wks gestation  Other Topics Concern   Not on file  Social History Narrative   Pt is from [place]. Lives in [place]. 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 90 tablet 3   diltiazem (CARDIZEM) 30 MG tablet Take 1 tablet (30 mg total) by mouth every 6 (six) hours as needed (for elevated heart rate). 30 tablet 3   No facility-administered medications prior to visit.    No Known Allergies  ROS Review of Systems  Constitutional: Negative.   HENT: Negative.    Eyes: Negative.   Respiratory: Negative.    Cardiovascular: Negative.   Gastrointestinal: Negative.   Genitourinary: Negative.   Musculoskeletal: Negative.   Skin: Negative.   Neurological: Negative.   Psychiatric/Behavioral: Negative.    All other systems reviewed and are negative.    Objective:    Physical Exam Vitals and nursing note reviewed.  Constitutional:      General: She is not in acute distress.    Appearance: Normal appearance. She is normal weight. She is not ill-appearing, toxic-appearing or diaphoretic.  Cardiovascular:     Rate and Rhythm: Normal rate and regular rhythm.     Heart sounds: Normal heart sounds. No murmur heard.   No friction rub. No gallop.  Pulmonary:     Effort: Pulmonary effort is normal. No respiratory distress.     Breath sounds: Normal breath sounds. No stridor. No wheezing, rhonchi or rales.  Chest:     Chest wall: No tenderness.  Skin:    General: Skin is warm and dry.  Neurological:     General: No focal deficit present.     Mental Status: She is alert and oriented to person, place, and time. Mental status is at baseline.  Psychiatric:        Mood and Affect: Mood normal.        Behavior: Behavior normal.        Thought Content: Thought content normal.        Judgment: Judgment normal.    Pulse 70   Temp 98.4 F (36.9 C) (Temporal)   Resp 18   Ht 5\' 3"  (1.6 m)   Wt 130 lb 3.2 oz (59.1 kg)   SpO2 99%   BMI 23.06 kg/m  Wt Readings from Last 3 Encounters:  03/25/21 130  lb 3.2 oz (59.1 kg)  03/01/21 130 lb 6.4 oz (59.1 kg)  02/25/21 130 lb 12.8 oz (59.3 kg)     There are no preventive care reminders to display for this patient.  There are no preventive care reminders to display for this patient.  Lab Results  Component Value Date   TSH 3.14 02/25/2021   Lab Results  Component Value Date   WBC 8.4 04/28/2020   HGB 14.3 04/28/2020   HCT 43.1 04/28/2020  MCV 91 04/28/2020   PLT 274 04/28/2020   Lab Results  Component Value Date   NA 141 04/28/2020   K 3.9 04/28/2020   CO2 25 04/28/2020   GLUCOSE 88 04/28/2020   BUN 15 04/28/2020   CREATININE 0.69 04/28/2020   BILITOT 1.1 01/08/2020   ALKPHOS 50 01/08/2020   AST 21 01/08/2020   ALT 16 01/08/2020   PROT 6.5 01/08/2020   ALBUMIN 4.0 01/08/2020   CALCIUM 9.7 04/28/2020   ANIONGAP 9 04/06/2020   Lab Results  Component Value Date   CHOL 173 01/12/2018   Lab Results  Component Value Date   HDL 66 01/12/2018   Lab Results  Component Value Date   LDLCALC 95 01/12/2018   Lab Results  Component Value Date   TRIG 60 01/12/2018   Lab Results  Component Value Date   CHOLHDL 2.6 01/12/2018   Lab Results  Component Value Date   HGBA1C 5.1 02/25/2021      Assessment & Plan:   Problem List Items Addressed This Visit   None Visit Diagnoses     Anxiety       Relevant Medications   sertraline (ZOLOFT) 50 MG tablet   sertraline (ZOLOFT) 25 MG tablet       Meds ordered this encounter  Medications   sertraline (ZOLOFT) 50 MG tablet    Sig: Take 1 tablet (50 mg total) by mouth daily.    Dispense:  90 tablet    Refill:  3    Order Specific Question:   Supervising Provider    Answer:   Carlota Raspberry, JEFFREY R [[id number]]   sertraline (ZOLOFT) 25 MG tablet    Sig: Take 1 tablet (25 mg total) by mouth daily.    Dispense:  90 tablet    Refill:  3    Order Specific Question:   Supervising Provider    Answer:   Carlota Raspberry, JEFFREY R [[id number]]    Follow-up: Return in about 1 year (around  03/25/2022) for CPE and labs.   PLAN Restart sertraline at 25mg . Titrate up as indicated  Discussed upwards titration to max of 100mg  po qd with patient who voices understanding Reviewed risks, benefits, side effects, and alternatives to this medication. Return annually for med check and CPE, sooner with concerns Patient encouraged to call clinic with any questions, comments, or concerns.  Maximiano Coss, NP

## 2021-03-25 NOTE — Patient Instructions (Addendum)
Ms. Chaise Passarella to see you! Glad 2022 is treating you well.   Restart sertraline at 25mg  daily. After a week or two, increase to 50. After another couple of weeks, ok to go to 75 daily.  I expect - as with any SSRI - going to take 4-6 weeks to see full effect from medication, though not unreasonable to expect 2-4 weeks in young healthy patients, especially those who have seen good therapeutic effect in the past.  Ok to increase up to 100mg  May see some GI upset with dose increases, but this will likely only last 2-3 days at most.   If incomplete effect, let me know. Can consider further dose increase, PRN medication, adjunct medication, or switching to another SSRI  Come in annually for a physical and labs.   Thank you  Rich     If you have lab work done today you will be contacted with your lab results within the next 2 weeks.  If you have not heard from [place] then please contact us. The fastest way to get your results is to register for My Chart.   IF you received an x-ray today, you will receive an invoice from [facility]. Please contact [facility] at [phone] with questions or concerns regarding your invoice.   IF you received labwork today, you will receive an invoice from [place]. Please contact [facility] at [phone] with questions or concerns regarding your invoice.   Our billing staff will not be able to assist you with questions regarding bills from these companies.  You will be contacted with the lab results as soon as they are available. The fastest way to get your results is to activate your My Chart account. Instructions are located on the last page of this paperwork. If you have not heard from [place] regarding the results in 2 weeks, please contact this office.

## 2021-04-20 ENCOUNTER — Encounter: Payer: Self-pay | Admitting: Registered Nurse

## 2021-04-22 ENCOUNTER — Other Ambulatory Visit: Payer: Self-pay | Admitting: Registered Nurse

## 2021-04-22 DIAGNOSIS — F419 Anxiety disorder, unspecified: Secondary | ICD-10-CM

## 2021-04-22 MED ORDER — SERTRALINE HCL 100 MG PO TABS: 100.0000 mg | ORAL_TABLET | Freq: Every day | ORAL | 3 refills | Status: DC

## 2021-06-16 ENCOUNTER — Telehealth (INDEPENDENT_AMBULATORY_CARE_PROVIDER_SITE_OTHER): Payer: No Typology Code available for payment source | Admitting: Registered Nurse

## 2021-06-16 ENCOUNTER — Encounter: Payer: Self-pay | Admitting: Registered Nurse

## 2021-06-16 ENCOUNTER — Other Ambulatory Visit: Payer: Self-pay

## 2021-06-16 DIAGNOSIS — L03111 Cellulitis of right axilla: Secondary | ICD-10-CM | POA: Diagnosis not present

## 2021-06-16 MED ORDER — SULFAMETHOXAZOLE-TRIMETHOPRIM 800-160 MG PO TABS: 1.0000 | ORAL_TABLET | Freq: Two times a day (BID) | ORAL | 0 refills | Status: DC

## 2021-06-16 NOTE — Patient Instructions (Signed)
° ° ° °  If you have lab work done today you will be contacted with your lab results within the next 2 weeks.  If you have not heard from us then please contact us. The fastest way to get your results is to register for My Chart. ° ° °IF you received an x-ray today, you will receive an invoice from [facility]. Please contact [facility] at [phone] with questions or concerns regarding your invoice.  ° °IF you received labwork today, you will receive an invoice from [facility]. Please contact [facility] at [phone] with questions or concerns regarding your invoice.  ° °Our billing staff will not be able to assist you with questions regarding bills from these companies. ° °You will be contacted with the lab results as soon as they are available. The fastest way to get your results is to activate your My Chart account. Instructions are located on the last page of this paperwork. If you have not heard from us regarding the results in 2 weeks, please contact this office. °  ° ° ° °

## 2021-06-16 NOTE — Progress Notes (Signed)
Telemedicine Encounter- SOAP NOTE Established Patient  This telephone encounter was conducted with the patient's (or proxy's) verbal consent via audio telecommunications: yes/no: Yes Patient was instructed to have this encounter in a suitably private space; and to only have persons present to whom they give permission to participate. In addition, patient identity was confirmed by use of name plus two identifiers (DOB and address).  I discussed the limitations, risks, security and privacy concerns of performing an evaluation and management service by telephone and the availability of in person appointments. I also discussed with the patient that there may be a patient responsible charge related to this service. The patient expressed understanding and agreed to proceed.  I spent a total of 13 minutes talking with the patient or their proxy.  Patient at home Provider in office  Participants: Kathrin Ruddy, NP and Merdis Delay  Chief Complaint  Patient presents with   Pain    Patient states she is having pain in her right armpit from an ingrown hair. Patient states its painful and swollen at the time.    Subjective   Jasmine Bell is a 35 y.o. established patient. Telephone visit today for ingrown hair  HPI R axilla Ongoing for a few days - starting to get somewhat better but still persists Starting to appear infected with redness, purulent drainage No local adenopathy, breast lump, streaking, intraarticular pain, limits to ROM at this time.   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   Paroxysmal atrial fibrillation ([place]) 03/23/2020   Impaired glucose tolerance 12/06/2019   Indication for care in labor and delivery, antepartum 09/23/2019   Status post primary low transverse cesarean section 09/23/2019   History of infertility 02/12/2016    Past Medical History:  Diagnosis Date   Anxiety     Gestational diabetes    gestetional   History of infertility    Infertility, female    Newborn product of IVF pregnancy    Paroxysmal atrial fibrillation (HCC)    A-fib   SVD (spontaneous vaginal delivery)    x 1    Current Outpatient Medications  Medication Sig Dispense Refill   sertraline (ZOLOFT) 100 MG tablet Take 1 tablet (100 mg total) by mouth daily. 90 tablet 3   sulfamethoxazole-trimethoprim (BACTRIM DS) 800-160 MG tablet Take 1 tablet by mouth 2 (two) times daily. 10 tablet 0   diltiazem (CARDIZEM) 30 MG tablet Take 1 tablet (30 mg total) by mouth every 6 (six) hours as needed (for elevated heart rate). 30 tablet 3   No current facility-administered medications for this visit.    No Known Allergies  Social History   Socioeconomic History   Marital status: Married    Spouse name: Not on file   Number of children: 1   Years of education: Not on file   Highest education level: Master's degree (e.g., MA, MS, MEng, MEd, MSW, MBA)  Occupational History   Occupation: CRNA  Tobacco Use   Smoking status: Never   Smokeless tobacco: Never  Vaping Use   Vaping Use: Never used  Substance and Sexual Activity   Alcohol use: Yes    Alcohol/week: 1.0 standard drink    Types: 1 Glasses of wine per week    Comment: 1 glass of wine per day   Drug use: No   Sexual activity: Yes    Partners: Male    Birth control/protection: None  Comment: approx [redacted] wks gestation  Other Topics Concern   Not on file  Social History Narrative   Pt is from [place]. 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  Genevieve was seen today for pain.  Diagnoses and all orders for this visit:  Cellulitis of right axilla -     sulfamethoxazole-trimethoprim (BACTRIM DS) 800-160 MG tablet; Take 1 tablet by mouth 2 (two) times daily.   PLAN Bactrim po bid x 5 days for infection Supportive care with hot compress. Ok to use topical abx. Return if worsening or failing to improve - can consider I&D vs derm referral. Patient encouraged to call clinic with any questions, comments, or concerns.   I discussed the assessment and treatment plan with the patient. The patient was provided an opportunity to ask questions and all were answered. The patient agreed with the plan and demonstrated an understanding of the instructions.   The patient was advised to call back or seek an in-person evaluation if the symptoms worsen or if the condition fails to improve as anticipated.  I provided 13 minutes of non-face-to-face time during this encounter.  Maximiano Coss, NP

## 2021-08-31 ENCOUNTER — Ambulatory Visit: Payer: No Typology Code available for payment source | Admitting: Internal Medicine

## 2021-10-18 ENCOUNTER — Ambulatory Visit: Payer: No Typology Code available for payment source | Admitting: Internal Medicine

## 2021-10-18 NOTE — Progress Notes (Deleted)
Patient ID: Jasmine Bell, female   DOB: 1986/02/21, 36 y.o.   MRN: [id number]  ? ?This visit occurred during the SARS-CoV-2 public health emergency.  Safety protocols were in place, including screening questions prior to the visit, additional usage of staff PPE, and extensive cleaning of exam room while observing appropriate contact time as indicated for disinfecting solutions.  ? ?HPI: ?Jasmine Bell is a 36 y.o.-year-old female, returning for follow-up for history of GDM, with impaired glucose tolerance and history of a left thyroid nodule, now s/o left thyroid lobectomy.  Last visit 6 months ago. ? ?Interim history: ?She had a cardiac ablation in 06/2020 >> some skipped beats, no A fib episodes. ?Lost 9 pounds before last visit after she changed her diet to a more plant-based diet. ? ?History of left thyroid nodule: ?-Incidentally found during investigation for tachycardia: ?01/08/2020: Chest CT angio: ?Low attenuating lesion in left lobe of the thyroid gland measures 0.9 x 0.9 x 1.7 cm. ? ?At that time, patient not in touch with me, and I ordered a thyroid ultrasound: ?01/16/2020: Thyroid U/S: ?Parenchymal Echotexture: Mildly heterogenous ?Isthmus: 0.3 cm ?Right lobe: 5.5 x 1.4 x 1.4 cm ?Left lobe: 5 x 1.4 x 1.6 cm ?_________________________________________________________ ?  ?Estimated total number of nodules >/= 1 cm: 1 ?_________________________________________________________ ?  ?Nodule # 2: ?Location: Left; Mid ?Maximum size: 1.8 cm; Other 2 dimensions: 1.1 x 1 cm ?Composition: solid/almost completely solid (2) ?Echogenicity: hypoechoic (2) ?ACR TI-RADS recommendations: ?**Given size (>/= 1.5 cm) and appearance, fine needle aspiration of ?this moderately suspicious nodule should be considered based on ?TI-RADS criteria. ?_________________________________________________________ ?  ?There is a small 0.7 cm spongiform thyroid nodule in the isthmus ?that does not meet criteria for FNA or follow-up. ?   ?IMPRESSION: ?1. Mildly heterogeneous, mildly enlarged thyroid gland. ?2. There is a 1.8 cm TR4 thyroid nodule in the left mid thyroid ?gland. Fine-needle aspiration is recommended for this thyroid ?nodule. ? ? ? ? ? ?We biopsied the nodule, with inconclusive results: ?FNA (01/23/2020):   ?- Atypia of undetermined significance (Bethesda category III)  ?SPECIMEN ADEQUACY:  ?Satisfactory for evaluation  ? ?Afirma: Suspicious ? 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 ?Pt denies: ?- feeling nodules in neck ?- hoarseness ?- dysphagia ?- choking ?- SOB with lying down ? ?No family history of thyroid disease.  No FH of thyroid cancer. No h/o radiation tx to head or neck. ? ?No herbal supplements. No Biotin use. No recent steroids use.  ? ?Reviewed her TFTs: ?07/2020: TSH 5.640 ?Lab Results  ?Component Value Date  ? TSH 3.14 02/25/2021  ? TSH 2.066 01/08/2020  ? TSH 1.550 01/12/2018  ? ?Impaired glucose tolerance: ? ?Before last visit, she switched to a more plant-based diet.  She had eggs and yoghurt only few times, and mostly eats fruit, vegetable, seeds, legumes.  She lost 9 pounds before last visit.  Since last visit, she gained some weight back when she was able to lose them again. ? ?Reviewed history: ?Pt has a history of infertility and had IVF in 2020.  She gave birth on 09/23/2019 (C-section) to an 8 pound 10 ounce healthy son.  She is on OCPs. ? ?During the pregnancy, she had gestational diabetes with 2 abnormal OGTT tests.  Results reviewed PER OB/GYN records: ? ?07/09/2019: 50 g glucose solution: ?Glucose 174 (65-1 39) ? ?07/17/2019: 100 g glucose solution: ?Fasting: Glucose 70 (65-94) ?1-hour: Glucose 145 (65-179) ?2-hour: Glucose 138 (65-154) ?3-hour: Glucose 148 (  73-428) ? ?08/20/2019: 100 g glucose solution: ?Fasting: Glucose 80 (65-94) ?1-hour: Glucose 213 (65-179) ?2-hour: Glucose 185 (65-154) ?3-hour: Glucose 115 (55-139) ? ?6 weeks postpartum:  ?- abnormal  (abnormal) - 150 (55-139) ? ?HbA1c at last visit was in the normal range: ?Lab Results  ?Component Value Date  ? HGBA1C 5.1 02/25/2021  ? HGBA1C 5.1 03/05/2020  ? HGBA1C 5.1 12/06/2019  ? ?She is not on any medications for her hyperglycemia. ? ?She is not checking sugars. ? ?She denies increased urination, thirst, hunger, also denies blurry vision and unintentional weight loss. ? ?-No CKD, last BUN/creatinine:  ?Lab Results  ?Component Value Date  ? BUN 15 04/28/2020  ? BUN 21 (H) 04/06/2020  ? CREATININE 0.69 04/28/2020  ? CREATININE 0.68 04/06/2020  ? ?-No HL; last set of lipids: ?Lab Results  ?Component Value Date  ? CHOL 173 01/12/2018  ? HDL 66 01/12/2018  ? [place] 95 01/12/2018  ? TRIG 60 01/12/2018  ? CHOLHDL 2.6 01/12/2018  ? ?Pt has FH of DM in P uncle, PGM. Father and PGF with prediabetes. ? ?She has a history of 3 miscarriages/retained placental parts in 2017, 18, 19.  She has 2 boys. ? ?She is a Immunologist in anesthesia.   ? ?ROS: ? + see HPI ?+ palpitations ? ?I reviewed pt's medications, allergies, PMH, social hx, family hx, and changes were documented in the history of present illness. Otherwise, unchanged from my initial visit note. ? ?Past Medical History:  ?Diagnosis Date  ? Anxiety   ? Gestational diabetes   ? gestetional  ? History of infertility   ? Infertility, female   ? Newborn product of IVF pregnancy   ? Paroxysmal atrial fibrillation (HCC)   ? A-fib  ? SVD (spontaneous vaginal delivery)   ? x 1  ? ?Past Surgical History:  ?Procedure Laterality Date  ? aerolar cyst on breast removed    ? ATRIAL FIBRILLATION ABLATION N/A 05/26/2020  ? Procedure: ATRIAL FIBRILLATION ABLATION;  Surgeon: Thompson Grayer, MD;  Location: [facility];  Service: Cardiovascular;  Laterality: N/A;  ? BREAST SURGERY    ? areolar cyst removal  ? CESAREAN SECTION N/A 09/23/2019  ? Procedure: CESAREAN SECTION;  Surgeon: Paula Compton, MD;  Location: [facility];  Service: Obstetrics;  Laterality: N/A;  ? DILATION AND  CURETTAGE OF UTERUS    ? DILATION AND EVACUATION  04/12/2016  ? Procedure: DILATATION AND EVACUATION;  Surgeon: Cheri Fowler, MD;  Location: [facility];  Service: Gynecology;;  ? DILATION AND EVACUATION N/A 05/17/2017  ? Procedure: DILATATION AND EVACUATION;  Surgeon: Paula Compton, MD;  Location: [facility];  Service: Gynecology;  Laterality: N/A;  ? DILATION AND EVACUATION N/A 04/10/2018  ? Procedure: DILATATION AND EVACUATION;  Surgeon: Paula Compton, MD;  Location: [facility];  Service: Gynecology;  Laterality: N/A;  ? TEE WITHOUT CARDIOVERSION N/A 05/26/2020  ? Procedure: TRANSESOPHAGEAL ECHOCARDIOGRAM (TEE);  Surgeon: Thompson Grayer, MD;  Location: [facility];  Service: Cardiovascular;  Laterality: N/A;  ? THYROID LOBECTOMY Left 04/13/2020  ? Procedure: THYROID LOBECTOMY;  Surgeon: Armandina Gemma, MD;  Location: [facility];  Service: General;  Laterality: Left;  ? WISDOM TOOTH EXTRACTION    ? ?Social History  ? ?Socioeconomic History  ? Marital status: Married  ?  Spouse name: Not on file  ? Number of children: 1  ? Years of education: Not on file  ? Highest education level: Master's degree (e.g., MA, MS, MEng, MEd, MSW, MBA)  ?Occupational  History  ? Occupation: Immunologist  ?Tobacco Use  ? Smoking status: Never  ? Smokeless tobacco: Never  ?Vaping Use  ? Vaping Use: Never used  ?Substance and Sexual Activity  ? Alcohol use: Yes  ?  Alcohol/week: 1.0 standard drink  ?  Types: 1 Glasses of wine per week  ?  Comment: 1 glass of wine per day  ? Drug use: No  ? Sexual activity: Yes  ?  Partners: Male  ?  Birth control/protection: None  ?  Comment: approx [redacted] wks gestation  ?Other Topics Concern  ? Not on file  ?Social History Narrative  ? Pt is from [place]. 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 Two children  ? ?Social Determinants of Health  ? ?Financial Resource Strain: Not on file  ?Food Insecurity: Not on file  ?Transportation Needs: Not on file  ?Physical Activity: Not on file  ?Stress:  Not on file  ?Social Connections: Not on file  ?Intimate Partner Violence: Not on file  ? ?Current Outpatient Medications on File Prior to Visit  ?Medication Sig Dispense Refill  ? diltiazem (CARDIZEM) 30 M

## 2021-12-26 IMAGING — DX DG CHEST 1V PORT
1 series · 2 of 2 positions shown · non-contrast
Comparison: None.

CLINICAL DATA: Chest pain.  Tachycardia.  Atrial fibrillation.

EXAM:
PORTABLE CHEST 1 VIEW

[Series 1: chest · 0.14mm/px · 2 of 2 slices shown]
[im 1/2]
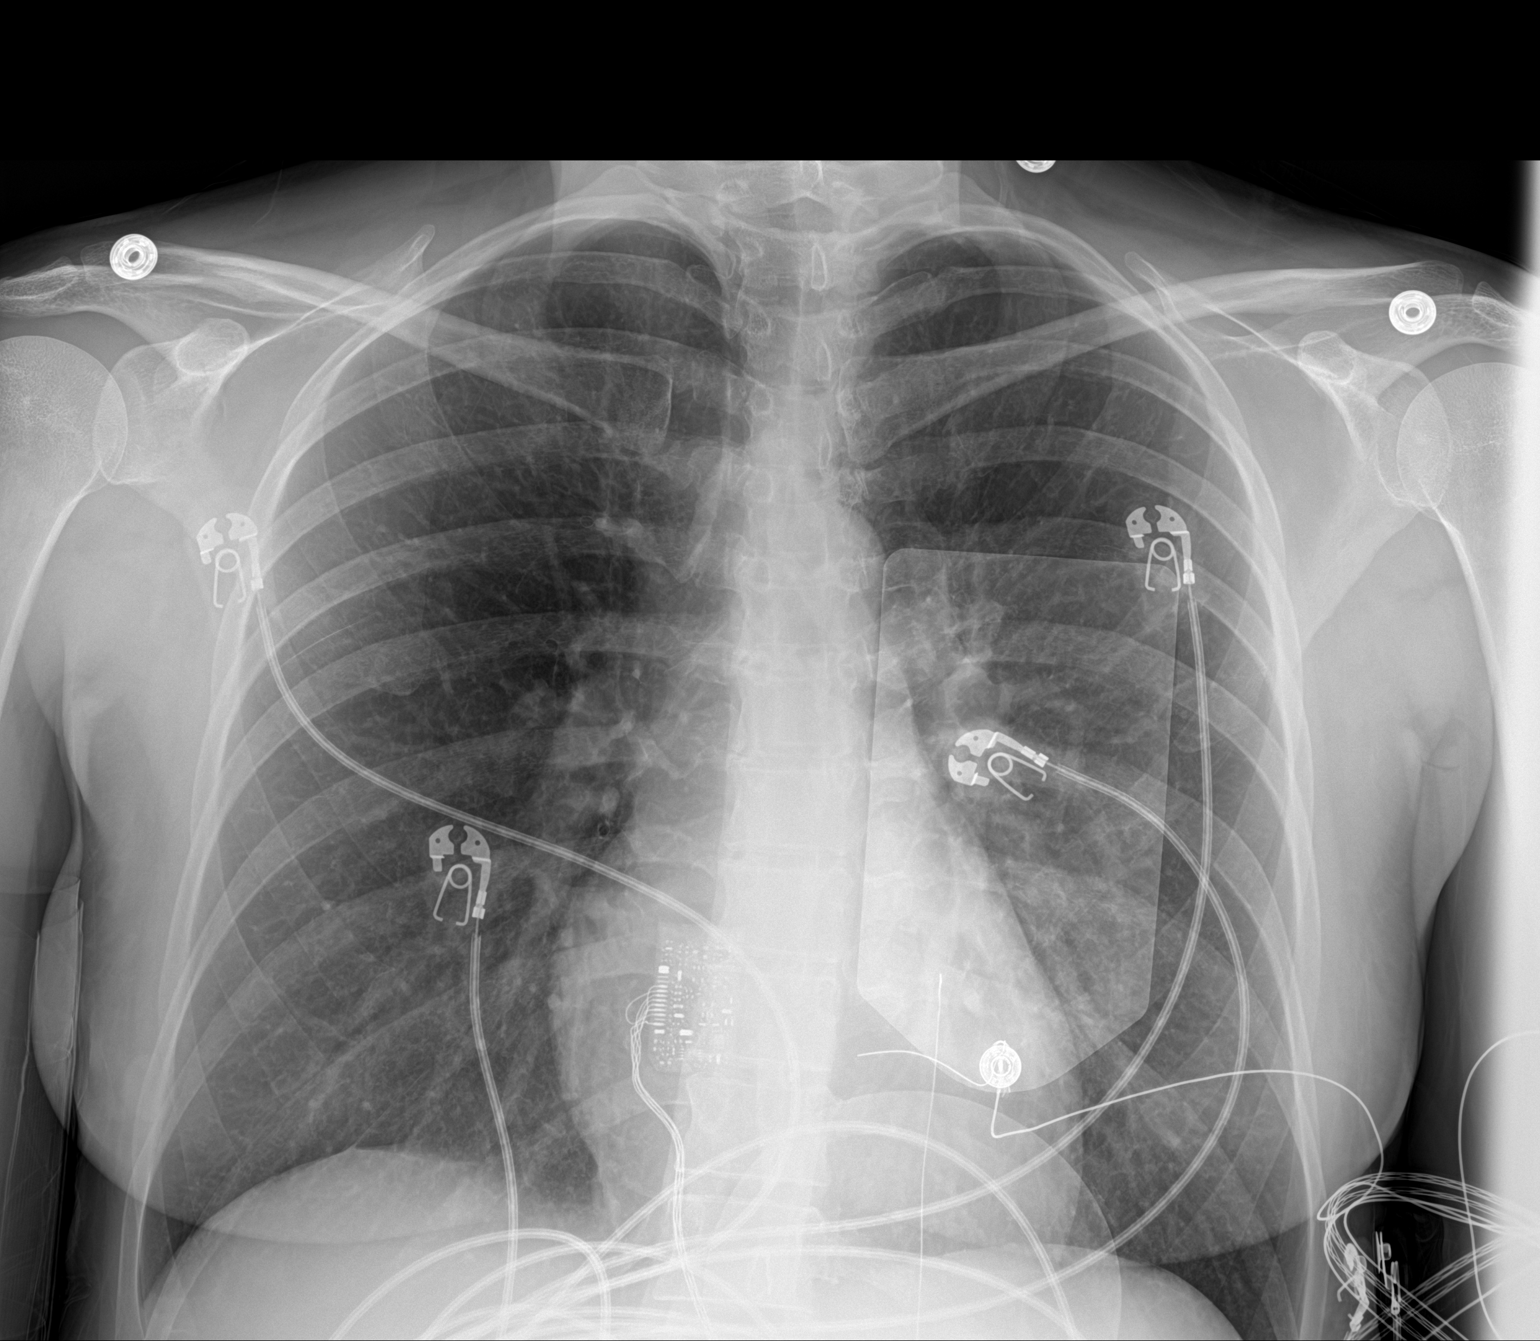
[im 2/2]
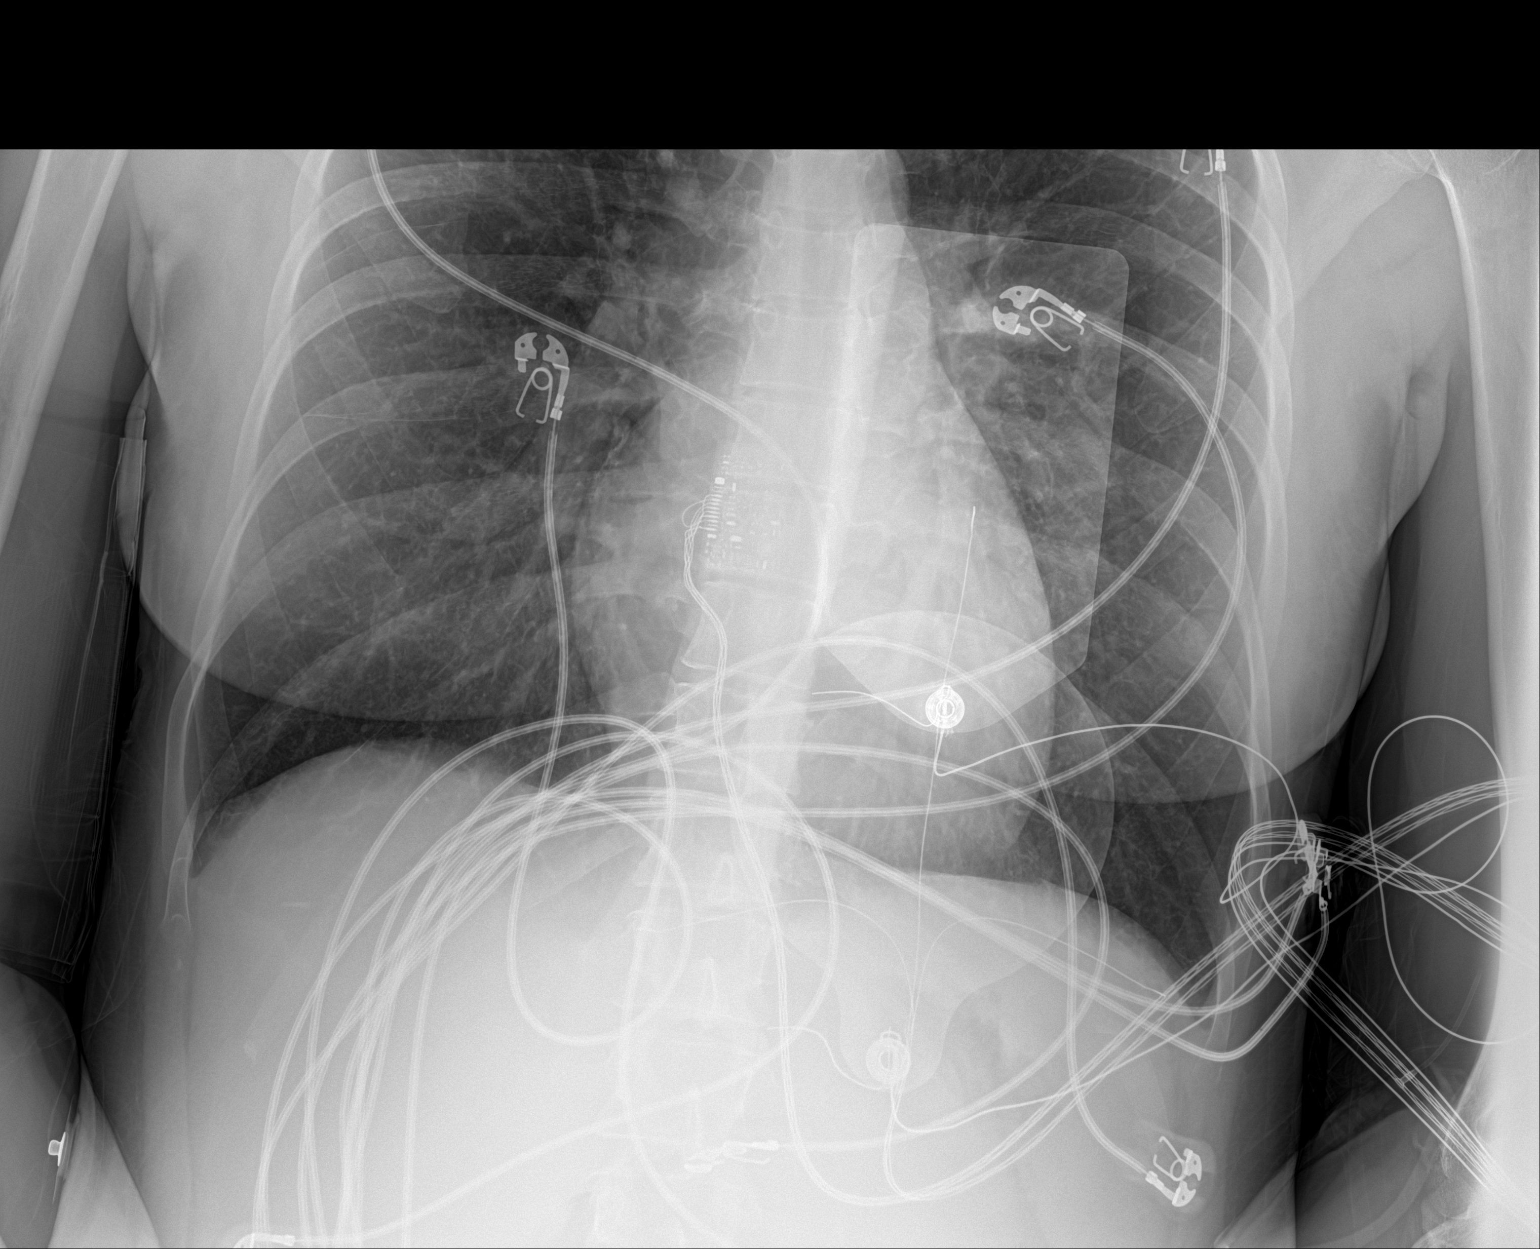

[2 of 2 positions shown; findings below may reference images not displayed]

FINDINGS: The heart size and mediastinal contours are within normal limits.
Both lungs are clear. The visualized skeletal structures are
unremarkable.
IMPRESSION: No active disease.

## 2021-12-26 IMAGING — CT CT ANGIO CHEST
2 of 7 series · 18 of 46 positions shown · IV contrast (APPLIED)
Comparison: Single-view of the chest earlier today.

CLINICAL DATA: Onset tachycardia and atrial fibrillation today.

EXAM:
CT ANGIOGRAPHY CHEST WITH CONTRAST
TECHNIQUE: Multidetector CT imaging of the chest was performed using the
standard protocol during bolus administration of intravenous
contrast. Multiplanar CT image reconstructions and MIPs were
obtained to evaluate the vascular anatomy.
CONTRAST:  70 mL OMNIPAQUE IOHEXOL 350 MG/ML SOLN

[Series 7: thins · axial · 0.57mm/px · z∈[+1296,+1522]mm · 15 of 364 slices shown]
[im 21/364  lung]
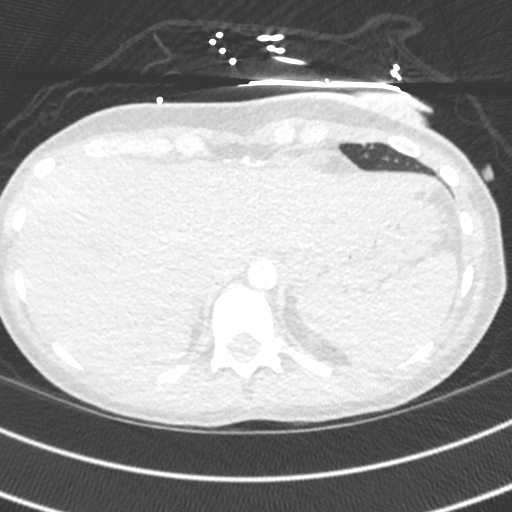
[im 41/364  soft-tissue]
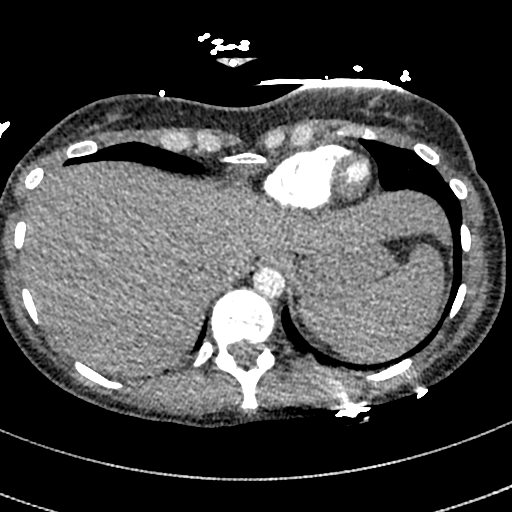
[im 61/364  lung]
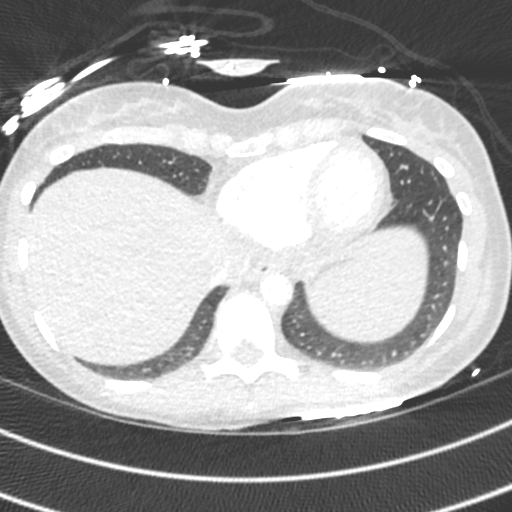
[im 81/364  soft-tissue]
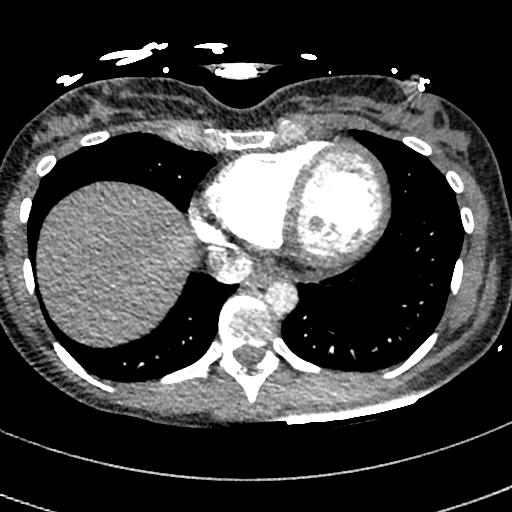
[im 122/364  lung]
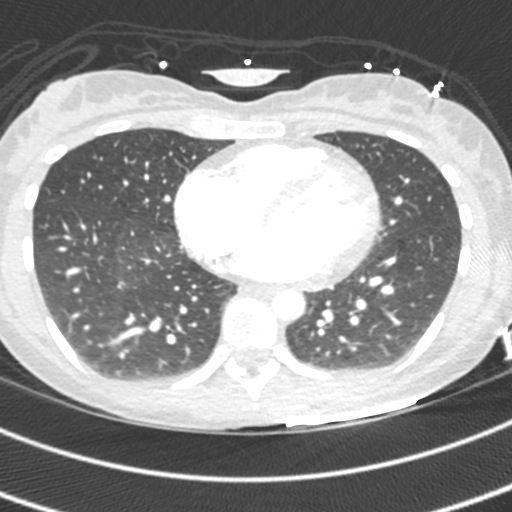
[im 142/364  soft-tissue]
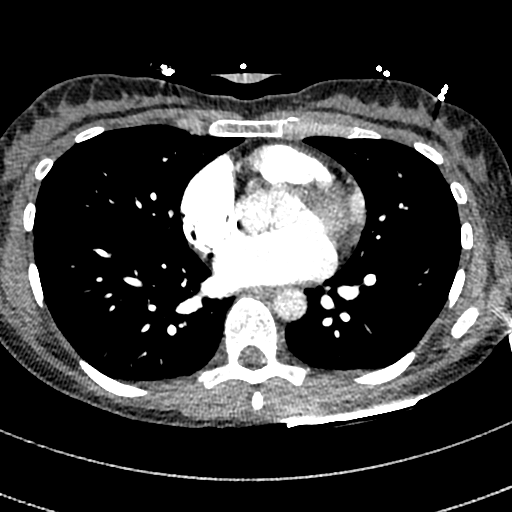
[im 162/364  lung]
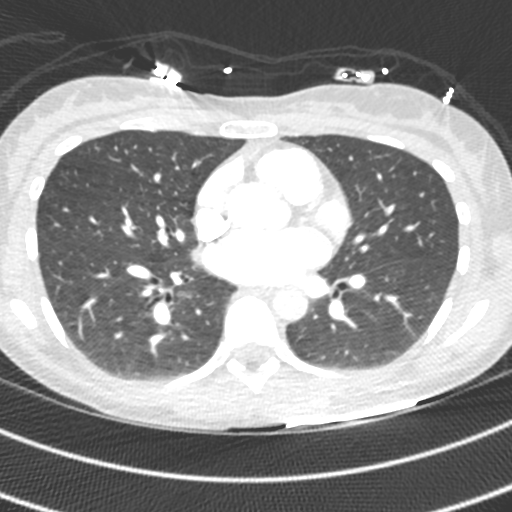
[im 182/364  soft-tissue]
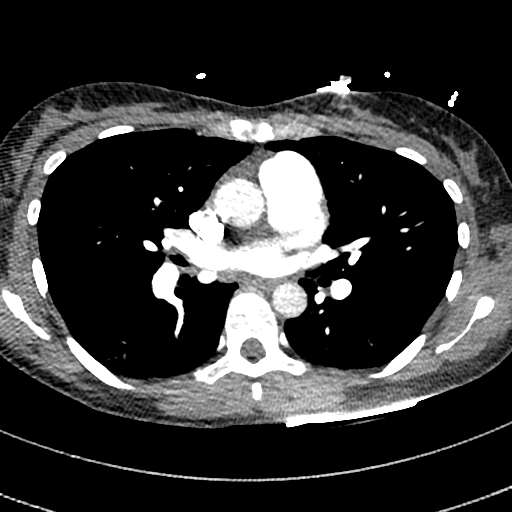
[im 202/364  lung]
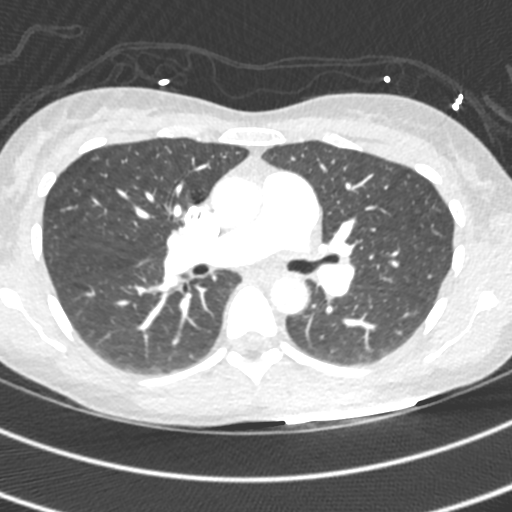
[im 222/364  soft-tissue]
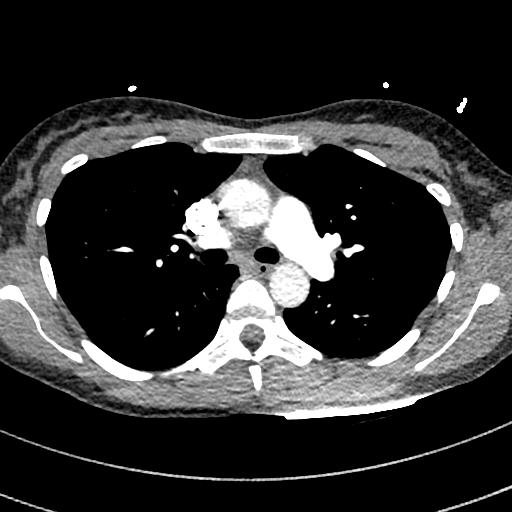
[im 243/364  lung]
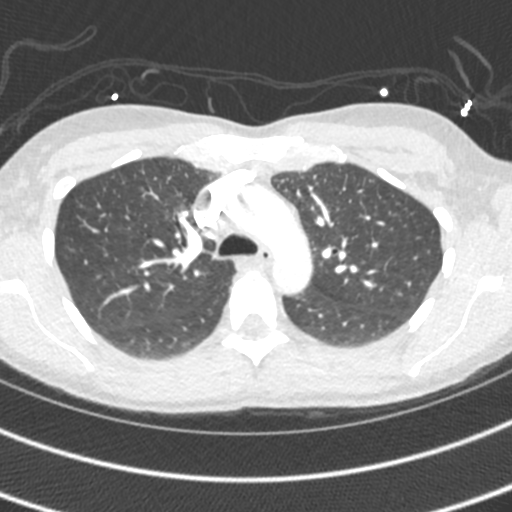
[im 283/364  soft-tissue]
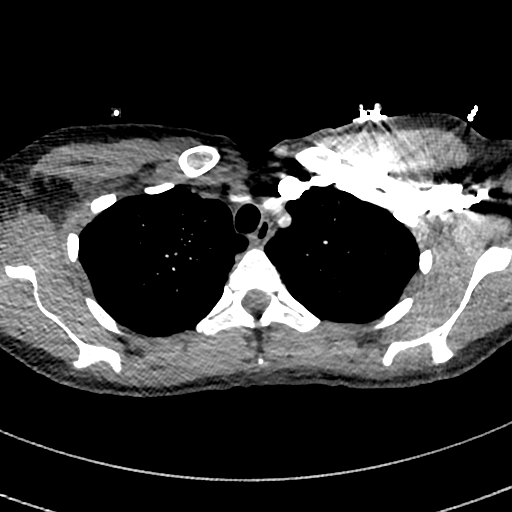
[im 303/364  lung]
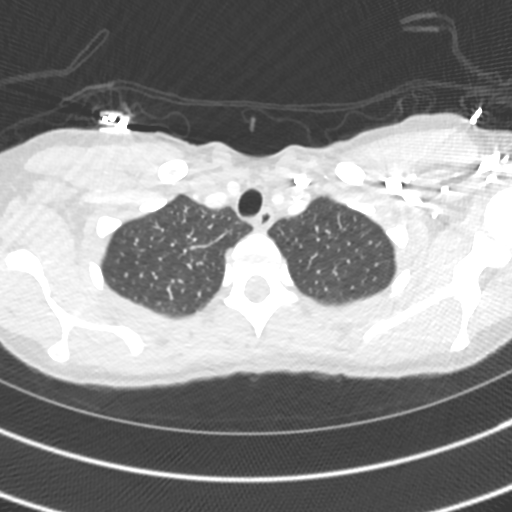
[im 323/364  soft-tissue]
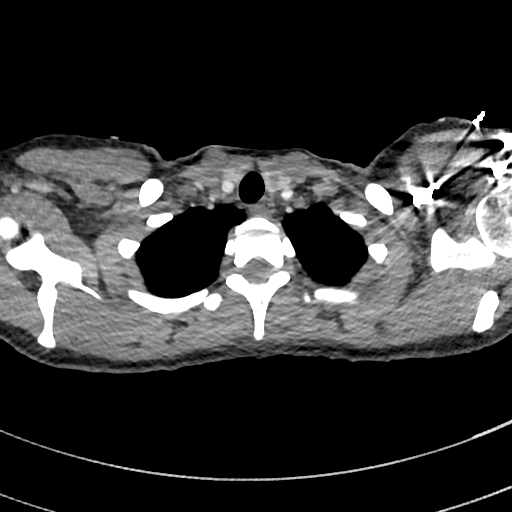
[im 343/364  lung]
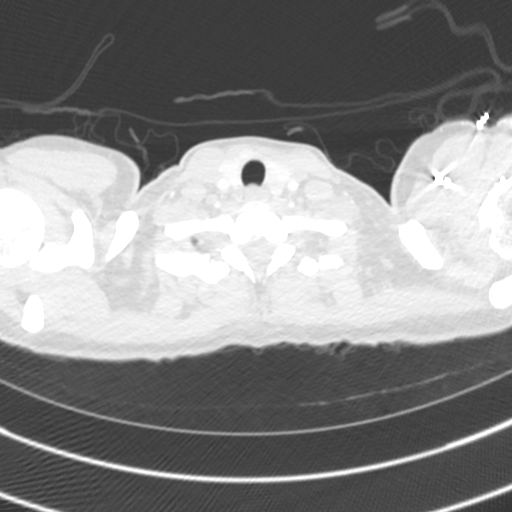

[Series 8: cor · coronal · 0.51mm/px · 3 of 111 slices shown]
[im 28/111  soft-tissue]
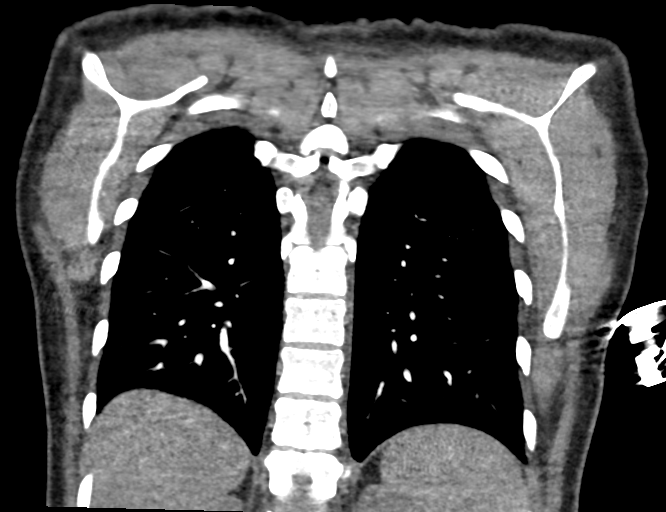
[im 56/111  soft-tissue]
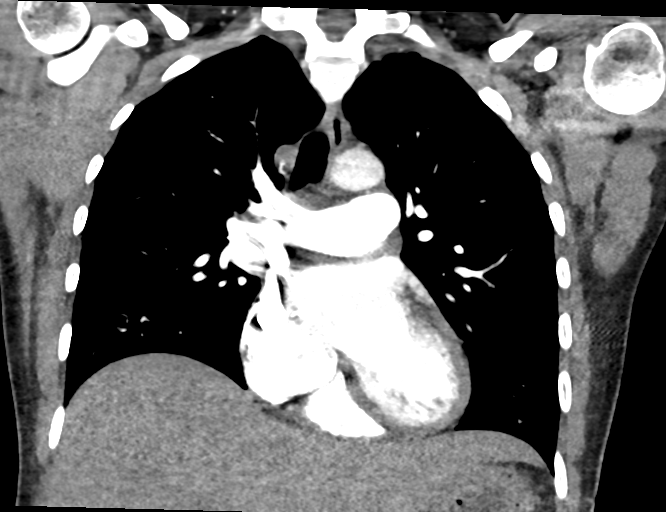
[im 83/111  soft-tissue]
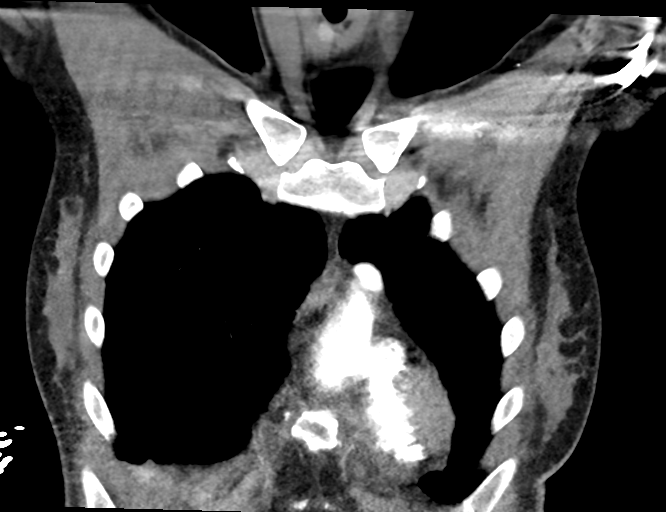

[18 of 46 positions shown; findings below may reference images not displayed]

FINDINGS: Cardiovascular: Satisfactory opacification of the pulmonary arteries
to the segmental level. No evidence of pulmonary embolism. Normal
heart size. No pericardial effusion.

Mediastinum/Nodes: No lymphadenopathy. The esophagus appears normal.
Low attenuating lesion in left lobe of the thyroid gland measures
0.9 x 0.9 x 1.7 cm.

Lungs/Pleura: Lungs clear.  No pleural or pericardial effusion.

Upper Abdomen: Negative.

Musculoskeletal: Negative.

Review of the MIP images confirms the above findings.
IMPRESSION: Negative for pulmonary embolus.  No acute abnormality.

1.7 cm lesion left lobe of the thyroid. Recommend thyroid US.(Ref: [HOSPITAL]. [DATE]): 143-50).

## 2022-03-24 ENCOUNTER — Other Ambulatory Visit: Payer: Self-pay

## 2022-03-24 DIAGNOSIS — F419 Anxiety disorder, unspecified: Secondary | ICD-10-CM

## 2022-03-24 MED ORDER — SERTRALINE HCL 100 MG PO TABS: 100.0000 mg | ORAL_TABLET | Freq: Every day | ORAL | 1 refills | Status: DC

## 2022-08-09 ENCOUNTER — Encounter: Payer: Self-pay | Admitting: Internal Medicine

## 2022-08-09 ENCOUNTER — Ambulatory Visit: Payer: Managed Care, Other (non HMO) | Admitting: Internal Medicine

## 2022-08-09 VITALS — BP 110/70 | HR 75 | Temp 97.9°F | Resp 16 | Ht 63.0 in | Wt 136.2 lb

## 2022-08-09 DIAGNOSIS — R7302 Impaired glucose tolerance (oral): Secondary | ICD-10-CM

## 2022-08-09 DIAGNOSIS — E890 Postprocedural hypothyroidism: Secondary | ICD-10-CM | POA: Insufficient documentation

## 2022-08-09 DIAGNOSIS — Z1159 Encounter for screening for other viral diseases: Secondary | ICD-10-CM | POA: Insufficient documentation

## 2022-08-09 DIAGNOSIS — Z124 Encounter for screening for malignant neoplasm of cervix: Secondary | ICD-10-CM

## 2022-08-09 DIAGNOSIS — Z0000 Encounter for general adult medical examination without abnormal findings: Secondary | ICD-10-CM | POA: Diagnosis not present

## 2022-08-09 LAB — BASIC METABOLIC PANEL
BUN: 9 mg/dL (ref 6–23)
CO2: 22 mEq/L (ref 19–32)
Calcium: 9.6 mg/dL (ref 8.4–10.5)
Chloride: 104 mEq/L (ref 96–112)
Creatinine, Ser: 0.65 mg/dL (ref 0.40–1.20)
GFR: 112.54 mL/min (ref >60.00)
Glucose, Bld: 95 mg/dL (ref 70–99)
Potassium: 3.7 mEq/L (ref 3.5–5.1)
Sodium: 139 mEq/L (ref 135–145)

## 2022-08-09 LAB — HEMOGLOBIN A1C: Hgb A1c MFr Bld: 5 % (ref 4.6–6.5)

## 2022-08-09 LAB — TSH: TSH: 2.15 u[IU]/mL (ref 0.35–5.50)

## 2022-08-09 NOTE — Patient Instructions (Signed)

## 2022-08-09 NOTE — Progress Notes (Signed)
Subjective:  Patient ID: Jasmine Bell, female    DOB: April 26, 1986  Age: 37 y.o. MRN: [id number]  CC: Annual Exam   HPI GLENDINE KEMERER presents for a CPX and to establish.  She has a remote history of A-fib with rapid ventricular response.  She is status post ablation and is doing well.  She has had no recent palpitations.  She is also status post partial thyroidectomy.  Outpatient Medications Prior to Visit  Medication Sig Dispense Refill   sertraline (ZOLOFT) 100 MG tablet Take 1 tablet (100 mg total) by mouth daily. 90 tablet 1   diltiazem (CARDIZEM) 30 MG tablet Take 1 tablet (30 mg total) by mouth every 6 (six) hours as needed (for elevated heart rate). 30 tablet 3   sulfamethoxazole-trimethoprim (BACTRIM DS) 800-160 MG tablet Take 1 tablet by mouth 2 (two) times daily. (Patient not taking: Reported on 08/09/2022) 10 tablet 0   No facility-administered medications prior to visit.    ROS Review of Systems  Constitutional: Negative.  Negative for fatigue and unexpected weight change.  HENT: Negative.    Eyes: Negative.   Respiratory:  Negative for cough, chest tightness and wheezing.   Cardiovascular:  Negative for chest pain, palpitations and leg swelling.  Gastrointestinal:  Negative for abdominal pain and nausea.  Endocrine: Negative.   Genitourinary: Negative.  Negative for difficulty urinating.  Musculoskeletal: Negative.   Skin: Negative.   Neurological: Negative.  Negative for dizziness and weakness.  Hematological:  Negative for adenopathy. Does not bruise/bleed easily.  Psychiatric/Behavioral: Negative.      Objective:  BP 110/70 (BP Location: Left Arm, Patient Position: Sitting, Cuff Size: Normal)   Pulse 75   Temp 97.9 F (36.6 C) (Oral)   Resp 16   Ht 5' 3"$  (1.6 m)   Wt 136 lb 3.2 oz (61.8 kg)   LMP 07/10/2022 (Approximate)   SpO2 99%   BMI 24.13 kg/m   BP Readings from Last 3 Encounters:  08/09/22 110/70  03/01/21 118/62  02/25/21 110/76     Wt Readings from Last 3 Encounters:  08/09/22 136 lb 3.2 oz (61.8 kg)  03/25/21 130 lb 3.2 oz (59.1 kg)  03/01/21 130 lb 6.4 oz (59.1 kg)    Physical Exam Vitals reviewed.  Constitutional:      Appearance: She is not ill-appearing.  HENT:     Nose: Nose normal.     Mouth/Throat:     Mouth: Mucous membranes are moist.  Eyes:     General: No scleral icterus.    Conjunctiva/sclera: Conjunctivae normal.  Neck:     Thyroid: No thyroid mass, thyromegaly or thyroid tenderness.  Cardiovascular:     Rate and Rhythm: Normal rate and regular rhythm.     Heart sounds: No murmur heard. Pulmonary:     Effort: Pulmonary effort is normal.     Breath sounds: No stridor. No wheezing, rhonchi or rales.  Abdominal:     General: Abdomen is flat.     Palpations: There is no mass.     Tenderness: There is no abdominal tenderness. There is no guarding.     Hernia: No hernia is present.  Musculoskeletal:        General: Normal range of motion.     Cervical back: Normal range of motion and neck supple.     Right lower leg: No edema.     Left lower leg: No edema.  Lymphadenopathy:     Cervical:     Right cervical:  No superficial, deep or posterior cervical adenopathy.    Left cervical: No superficial, deep or posterior cervical adenopathy.  Skin:    General: Skin is warm and dry.     Coloration: Skin is not pale.  Neurological:     General: No focal deficit present.     Mental Status: She is alert. Mental status is at baseline.  Psychiatric:        Mood and Affect: Mood normal.        Behavior: Behavior normal.     Lab Results  Component Value Date   WBC 8.4 04/28/2020   HGB 14.3 04/28/2020   HCT 43.1 04/28/2020   PLT 274 04/28/2020   GLUCOSE 95 08/09/2022   CHOL 173 01/12/2018   TRIG 60 01/12/2018   HDL 66 01/12/2018   LDLCALC 95 01/12/2018   ALT 16 01/08/2020   AST 21 01/08/2020   NA 139 08/09/2022   K 3.7 08/09/2022   CL 104 08/09/2022   CREATININE 0.65 08/09/2022    BUN 9 08/09/2022   CO2 22 08/09/2022   TSH 2.15 08/09/2022   HGBA1C 5.0 08/09/2022    No results found.  Assessment & Plan:   Claribel was seen today for annual exam.  Diagnoses and all orders for this visit:  Impaired glucose tolerance- Her A1c is normal. -     Hemoglobin A1c; Future -     Basic metabolic panel; Future -     Basic metabolic panel -     Hemoglobin A1c  S/P partial thyroidectomy- She is euthyroid. -     TSH; Future -     TSH  Routine general medical examination at a health care facility- Exam completed, labs reviewed, vaccines reviewed and updated, she will undergo a Pap smear soon, no other cancer screenings indicated, patient education was given. -     Hepatitis C antibody; Future -     Hepatitis C antibody  Need for hepatitis C screening test -     Hepatitis C antibody; Future -     Hepatitis C antibody   I have discontinued Geeta W. Domek's diltiazem and sulfamethoxazole-trimethoprim. I am also having her maintain her sertraline.  No orders of the defined types were placed in this encounter.    Follow-up: Return in about 1 year (around 08/10/2023).  Scarlette Calico, MD

## 2022-08-10 LAB — HEPATITIS C ANTIBODY: Hepatitis C Ab: NONREACTIVE

## 2022-08-12 ENCOUNTER — Encounter: Payer: Self-pay | Admitting: Internal Medicine

## 2022-09-23 ENCOUNTER — Other Ambulatory Visit: Payer: Self-pay | Admitting: Family Medicine

## 2022-09-23 ENCOUNTER — Other Ambulatory Visit: Payer: Self-pay | Admitting: Internal Medicine

## 2022-09-23 ENCOUNTER — Encounter: Payer: Self-pay | Admitting: Internal Medicine

## 2022-09-23 DIAGNOSIS — F419 Anxiety disorder, unspecified: Secondary | ICD-10-CM

## 2022-09-23 DIAGNOSIS — F411 Generalized anxiety disorder: Secondary | ICD-10-CM

## 2022-09-23 MED ORDER — SERTRALINE HCL 100 MG PO TABS: 100.0000 mg | ORAL_TABLET | Freq: Every day | ORAL | 1 refills | Status: DC

## 2023-03-31 ENCOUNTER — Other Ambulatory Visit: Payer: Self-pay | Admitting: Internal Medicine

## 2023-03-31 DIAGNOSIS — F411 Generalized anxiety disorder: Secondary | ICD-10-CM

## 2023-04-05 NOTE — Progress Notes (Unsigned)
Electrophysiology Office Note:   Date:  04/06/2023  ID:  Jasmine Bell, DOB 10-11-1985, MRN [id number]  Primary Cardiologist: Donato Schultz, MD Electrophysiologist: Nobie Putnam, MD      History of Present Illness:   Jasmine Bell is a 37 y.o. female with h/o hyperthyroidism s/p partial thyroidectomy and atrial fibrillation s/p ablation 05/26/20 seen today for palpitations.  Patient reports that she recently had a cold and since then has noticed increased frequency of palpitations. One morning she developed palpitations that felt similar to her prior episodes of atrial fibrillation, lasting approximately 15 minutes.  She has taken oral diltiazem as needed during these episodes with improvement.  Previously done well for many years.  Review of systems complete and found to be negative unless listed in HPI.   EP Information / Studies Reviewed:    EKG is ordered today. Personal review as below.  EKG Interpretation Date/Time:  Thursday April 06 2023 16:19:36 EDT Ventricular Rate:  79 PR Interval:  136 QRS Duration:  86 QT Interval:  390 QTC Calculation: 447 R Axis:   12  Text Interpretation: Normal sinus rhythm Incomplete right bundle branch block When compared with ECG of 02-Jul-2020 15:37, No significant change was found Confirmed by Nobie Putnam [phone]) on 04/06/2023 4:54:12 PM   TEE 05/26/20:  Normal LV size and function. LVEF is 70 to 75%. Normal RV size and function. No significant valvular disease. Small atrial septal defect with predominantly left-to-right shunting. Left atrium normal in size.  Right atrium normal in size.  Risk Assessment/Calculations:    CHA2DS2-VASc Score =     This indicates a  % annual risk of stroke. The patient's score is based upon: CHF History: 0 HTN History: 0 Diabetes History: 0 Vascular Disease History: 0 Age Score: 0 Gender Score: 1              Physical Exam:   VS:  BP 112/70   Pulse 79   Ht 5\' 2"  (1.575 m)   Wt 132 lb  (59.9 kg)   SpO2 98%   BMI 24.14 kg/m    Wt Readings from Last 3 Encounters:  04/06/23 132 lb (59.9 kg)  08/09/22 136 lb 3.2 oz (61.8 kg)  03/25/21 130 lb 3.2 oz (59.1 kg)     GEN: Well nourished, well developed in no acute distress NECK: No JVD; No carotid bruits CARDIAC: Regular rate and rhythm, no murmurs, rubs, gallops RESPIRATORY:  Clear to auscultation without rales, wheezing or rhonchi  ABDOMEN: Soft, non-tender, non-distended EXTREMITIES:  No edema; No deformity   ASSESSMENT AND PLAN:   Jasmine Bell is a 37 y.o. female with h/o hyperthyroidism s/p partial thyroidectomy and atrial fibrillation s/p ablation 05/26/20 seen today for palpitations.   #. Palpitations: Recent uptake and palpitations in the setting of a cold.  It is unclear what exactly the etiology is as we have no monitoring; however, she does state that it felt similar to her prior episodes of atrial fibrillation.  Longest lasting episode appeared to be about 15 minutes.  Episodes resolved with oral as needed Cardizem.  -Continue oral Cardizem as needed. -We will order a 2-week ZIO monitor to monitor for PACs, PVCs, SVT, and/or recurrence of atrial fibrillation.  #.  Paroxysmal atrial fibrillation status post ablation 05/26/2020: - No documented recurrence. -CHA2DS2-VASc score is 1 with her only point being for female sex.  Relative stroke risk is low in this setting.  She will remain off oral anticoagulation for now.  Follow  up with Dr. Jimmey Ralph  in 8 weeks.   Signed, Nobie Putnam, MD

## 2023-04-06 ENCOUNTER — Ambulatory Visit (INDEPENDENT_AMBULATORY_CARE_PROVIDER_SITE_OTHER): Payer: 59

## 2023-04-06 ENCOUNTER — Ambulatory Visit: Payer: 59 | Attending: Cardiology | Admitting: Cardiology

## 2023-04-06 ENCOUNTER — Other Ambulatory Visit: Payer: Self-pay | Admitting: Cardiology

## 2023-04-06 ENCOUNTER — Encounter: Payer: Self-pay | Admitting: Cardiology

## 2023-04-06 VITALS — BP 112/70 | HR 79 | Ht 62.0 in | Wt 132.0 lb

## 2023-04-06 DIAGNOSIS — Z0000 Encounter for general adult medical examination without abnormal findings: Secondary | ICD-10-CM

## 2023-04-06 DIAGNOSIS — R002 Palpitations: Secondary | ICD-10-CM | POA: Diagnosis not present

## 2023-04-06 DIAGNOSIS — I480 Paroxysmal atrial fibrillation: Secondary | ICD-10-CM

## 2023-04-06 MED ORDER — DILTIAZEM HCL 30 MG PO TABS: 30.0000 mg | ORAL_TABLET | ORAL | 3 refills | Status: AC | PRN

## 2023-04-06 NOTE — Progress Notes (Unsigned)
Enrolled for [employer] to mail a ZIO XT long term holter monitor to the patients address on file.  

## 2023-04-06 NOTE — Patient Instructions (Signed)
Medication Instructions:  Your physician recommends that you continue on your current medications as directed. Please refer to the Current Medication list given to you today.  *If you need a refill on your cardiac medications before your next appointment, please call your pharmacy*  Testing/Procedures: Your physician has recommended that you wear an event monitor. Event monitors are medical devices that record the heart's electrical activity. Doctors most often [place] these monitors to diagnose arrhythmias. Arrhythmias are problems with the speed or rhythm of the heartbeat. The monitor is a small, portable device. You can wear one while you do your normal daily activities. This is usually used to diagnose what is causing palpitations/syncope (passing out).  Follow-Up: At [facility], you and your health needs are our priority.  As part of our continuing mission to provide you with exceptional heart care, we have created designated Provider Care Teams.  These Care Teams include your primary Cardiologist (physician) and Advanced Practice Providers (APPs -  Physician Assistants and Nurse Practitioners) who all work together to provide you with the care you need, when you need it.  Your next appointment:   8 weeks  Provider:   Nobie Putnam, MD    Other Instructions Christena Deem- Long Term Monitor Instructions  Your physician has requested you wear a ZIO patch monitor for 14 days.  This is a single patch monitor. [employer] supplies one patch monitor per enrollment. Additional stickers are not available. Please do not apply patch if you will be having a Nuclear Stress Test,  Echocardiogram, Cardiac CT, MRI, or Chest Xray during the period you would be wearing the  monitor. The patch cannot be worn during these tests. You cannot remove and re-apply the  ZIO XT patch monitor.  Your ZIO patch monitor will be mailed 3 day USPS to your address on file. It may take 3-5 days  to receive your monitor  after you have been enrolled.  Once you have received your monitor, please review the enclosed instructions. Your monitor  has already been registered assigning a specific monitor serial # to you.  Billing and Patient Assistance Program Information  We have supplied [employer] with any of your insurance information on file for billing purposes. [employer] offers a sliding scale Patient Assistance Program for patients that do not have  insurance, or whose insurance does not completely cover the cost of the ZIO monitor.  You must apply for the Patient Assistance Program to qualify for this discounted rate.  To apply, please call [employer] at [phone], select option 4, select option 2, ask to apply for  Patient Assistance Program. Meredeth Ide will ask your household income, and how many people  are in your household. They will quote your out-of-pocket cost based on that information.  [employer] will also be able to set up a 44-month, interest-free payment plan if needed.  Applying the monitor   Shave hair from upper left chest.  Hold abrader disc by orange tab. Rub abrader in 40 strokes over the upper left chest as  indicated in your monitor instructions.  Clean area with 4 enclosed alcohol pads. Let dry.  Apply patch as indicated in monitor instructions. Patch will be placed under collarbone on left  side of chest with arrow pointing upward.  Rub patch adhesive wings for 2 minutes. Remove white label marked "1". Remove the white  label marked "2". Rub patch adhesive wings for 2 additional minutes.  While looking in a mirror, press and release button in center of patch.  A small green light will  flash 3-4 times. This will be your only indicator that the monitor has been turned on.  Do not shower for the first 24 hours. You may shower after the first 24 hours.  Press the button if you feel a symptom. You will hear a small click. Record Date, Time and  Symptom in the Patient Logbook.  When you are  ready to remove the patch, follow instructions on the last 2 pages of Patient  Logbook. Stick patch monitor onto the last page of Patient Logbook.  Place Patient Logbook in the blue and white box. Use locking tab on box and tape box closed  securely. The blue and white box has prepaid postage on it. Please place it in the mailbox as  soon as possible. Your physician should have your test results approximately 7 days after the  monitor has been mailed back to [facility].  Call [facility] Customer Care at [phone] if you have questions regarding  your ZIO XT patch monitor. Call them immediately if you see an orange light blinking on your  monitor.  If your monitor falls off in less than 4 days, contact our Monitor department at [phone].  If your monitor becomes loose or falls off after 4 days call [employer] at [phone] for  suggestions on securing your monitor

## 2023-04-24 DIAGNOSIS — R002 Palpitations: Secondary | ICD-10-CM

## 2023-06-05 ENCOUNTER — Encounter: Payer: Self-pay | Admitting: Cardiology

## 2023-06-06 ENCOUNTER — Ambulatory Visit: Payer: 59 | Admitting: Cardiology

## 2023-09-14 ENCOUNTER — Other Ambulatory Visit: Payer: Self-pay | Admitting: Internal Medicine

## 2023-09-14 ENCOUNTER — Encounter: Payer: Self-pay | Admitting: Internal Medicine

## 2023-09-14 ENCOUNTER — Other Ambulatory Visit: Payer: Self-pay

## 2023-09-14 DIAGNOSIS — F411 Generalized anxiety disorder: Secondary | ICD-10-CM

## 2023-09-14 MED ORDER — SERTRALINE HCL 100 MG PO TABS
100.0000 mg | ORAL_TABLET | Freq: Every day | ORAL | 0 refills | Status: DC
Start: 2023-09-14 — End: 2023-10-05

## 2023-10-05 ENCOUNTER — Encounter: Payer: Self-pay | Admitting: Internal Medicine

## 2023-10-05 ENCOUNTER — Ambulatory Visit: Admitting: Internal Medicine

## 2023-10-05 VITALS — BP 108/72 | HR 87 | Temp 97.9°F | Resp 16 | Ht 62.0 in | Wt 130.4 lb

## 2023-10-05 DIAGNOSIS — Z13220 Encounter for screening for lipoid disorders: Secondary | ICD-10-CM | POA: Diagnosis not present

## 2023-10-05 DIAGNOSIS — I480 Paroxysmal atrial fibrillation: Secondary | ICD-10-CM | POA: Diagnosis not present

## 2023-10-05 DIAGNOSIS — I950 Idiopathic hypotension: Secondary | ICD-10-CM

## 2023-10-05 DIAGNOSIS — R7302 Impaired glucose tolerance (oral): Secondary | ICD-10-CM

## 2023-10-05 DIAGNOSIS — F411 Generalized anxiety disorder: Secondary | ICD-10-CM

## 2023-10-05 DIAGNOSIS — Z124 Encounter for screening for malignant neoplasm of cervix: Secondary | ICD-10-CM

## 2023-10-05 DIAGNOSIS — Z0000 Encounter for general adult medical examination without abnormal findings: Secondary | ICD-10-CM

## 2023-10-05 DIAGNOSIS — R7989 Other specified abnormal findings of blood chemistry: Secondary | ICD-10-CM

## 2023-10-05 LAB — HEPATIC FUNCTION PANEL
ALT: 15 U/L (ref 0–35)
AST: 16 U/L (ref 0–37)
Albumin: 4.6 g/dL (ref 3.5–5.2)
Alkaline Phosphatase: 38 U/L — ABNORMAL LOW (ref 39–117)
Bilirubin, Direct: 0.2 mg/dL (ref 0.0–0.3)
Total Bilirubin: 1 mg/dL (ref 0.2–1.2)
Total Protein: 7 g/dL (ref 6.0–8.3)

## 2023-10-05 LAB — CBC WITH DIFFERENTIAL/PLATELET
Basophils Absolute: 0 10*3/uL (ref 0.0–0.1)
Basophils Relative: 0.6 % (ref 0.0–3.0)
Eosinophils Absolute: 0.1 10*3/uL (ref 0.0–0.7)
Eosinophils Relative: 2.2 % (ref 0.0–5.0)
HCT: 39.8 % (ref 36.0–46.0)
Hemoglobin: 13.3 g/dL (ref 12.0–15.0)
Lymphocytes Relative: 36.6 % (ref 12.0–46.0)
Lymphs Abs: 1.6 10*3/uL (ref 0.7–4.0)
MCHC: 33.5 g/dL (ref 30.0–36.0)
MCV: 95.1 fl (ref 78.0–100.0)
Monocytes Absolute: 0.4 10*3/uL (ref 0.1–1.0)
Monocytes Relative: 8.7 % (ref 3.0–12.0)
Neutro Abs: 2.3 10*3/uL (ref 1.4–7.7)
Neutrophils Relative %: 51.9 % (ref 43.0–77.0)
Platelets: 233 10*3/uL (ref 150.0–400.0)
RBC: 4.19 Mil/uL (ref 3.87–5.11)
RDW: 12.7 % (ref 11.5–15.5)
WBC: 4.5 10*3/uL (ref 4.0–10.5)

## 2023-10-05 LAB — LIPID PANEL
Cholesterol: 178 mg/dL (ref 0–200)
HDL: 60.1 mg/dL (ref >39.00)
LDL Cholesterol: 106 mg/dL — ABNORMAL HIGH (ref 0–99)
NonHDL: 117.59
Total CHOL/HDL Ratio: 3
Triglycerides: 60 mg/dL (ref 0.0–149.0)
VLDL: 12 mg/dL (ref 0.0–40.0)

## 2023-10-05 LAB — BASIC METABOLIC PANEL WITH GFR
BUN: 10 mg/dL (ref 6–23)
CO2: 27 meq/L (ref 19–32)
Calcium: 9.2 mg/dL (ref 8.4–10.5)
Chloride: 104 meq/L (ref 96–112)
Creatinine, Ser: 0.64 mg/dL (ref 0.40–1.20)
GFR: 112.04 mL/min (ref >60.00)
Glucose, Bld: 87 mg/dL (ref 70–99)
Potassium: 3.6 meq/L (ref 3.5–5.1)
Sodium: 138 meq/L (ref 135–145)

## 2023-10-05 LAB — CORTISOL: Cortisol, Plasma: 2.7 ug/dL

## 2023-10-05 LAB — HEMOGLOBIN A1C: Hgb A1c MFr Bld: 5.2 % (ref 4.6–6.5)

## 2023-10-05 LAB — TSH: TSH: 2.72 u[IU]/mL (ref 0.35–5.50)

## 2023-10-05 MED ORDER — SERTRALINE HCL 100 MG PO TABS
100.0000 mg | ORAL_TABLET | Freq: Every day | ORAL | 1 refills | Status: AC
Start: 2023-10-05 — End: ?

## 2023-10-05 NOTE — Patient Instructions (Signed)

## 2023-10-05 NOTE — Progress Notes (Signed)
 Subjective:  Patient ID: Jasmine Bell, female    DOB: 09-23-1985  Age: 38 y.o. MRN: [id number]  CC: Annual Exam and Atrial Fibrillation   HPI Jasmine Bell presents for a CPX and f/up ----  Discussed the use of AI scribe software for clinical note transcription with the patient, who gave verbal consent to proceed.  History of Present Illness   Jasmine Bell is a 38 year old female with anxiety and atrial fibrillation who presents for routine follow-up.  Anxiety is well-controlled with sertraline (Zoloft). Sleep, weight, and appetite are stable. No pain, swelling, dizziness, or lightheadedness.  She takes diltiazem (Cardizem) for atrial fibrillation as needed, approximately once every three months during flare-ups. A Holter monitor test two months ago showed no significant issues despite minor irregularities attributed to stress. She recently saw her cardiologist, who performed an EKG and other evaluations. No dizziness or lightheadedness reported.  Her last menstrual cycle started two days ago. She is not on contraception, using condoms with her husband.       Outpatient Medications Prior to Visit  Medication Sig Dispense Refill   diltiazem (CARDIZEM) 30 MG tablet Take 1 tablet (30 mg total) by mouth as needed (palpations). 30 tablet 3   sertraline (ZOLOFT) 100 MG tablet Take 1 tablet (100 mg total) by mouth daily. 21 tablet 0   No facility-administered medications prior to visit.    ROS Review of Systems  Constitutional: Negative.  Negative for appetite change, fatigue and unexpected weight change.  HENT: Negative.    Eyes: Negative.   Respiratory: Negative.  Negative for cough, chest tightness, shortness of breath and wheezing.   Cardiovascular:  Negative for chest pain, palpitations and leg swelling.  Gastrointestinal: Negative.  Negative for abdominal pain, constipation, diarrhea, nausea, rectal pain and vomiting.  Endocrine: Negative.   Genitourinary:  Negative.  Negative for difficulty urinating.  Musculoskeletal: Negative.   Skin: Negative.   Neurological:  Negative for dizziness and weakness.  Hematological:  Negative for adenopathy. Does not bruise/bleed easily.  Psychiatric/Behavioral:  Negative for behavioral problems, confusion, decreased concentration, sleep disturbance and suicidal ideas. The patient is nervous/anxious.     Objective:  BP 108/72 (BP Location: Left Arm, Patient Position: Sitting, Cuff Size: Normal)   Pulse 87   Temp 97.9 F (36.6 C) (Oral)   Resp 16   Ht 5\' 2"  (1.575 m)   Wt 130 lb 6.4 oz (59.1 kg)   LMP 10/04/2023 (Exact Date)   SpO2 98%   BMI 23.85 kg/m   BP Readings from Last 3 Encounters:  10/05/23 108/72  04/06/23 112/70  08/09/22 110/70    Wt Readings from Last 3 Encounters:  10/05/23 130 lb 6.4 oz (59.1 kg)  04/06/23 132 lb (59.9 kg)  08/09/22 136 lb 3.2 oz (61.8 kg)    Physical Exam Vitals reviewed.  Constitutional:      Appearance: Normal appearance.  HENT:     Mouth/Throat:     Mouth: Mucous membranes are moist.  Eyes:     General: No scleral icterus.    Pupils: Pupils are equal, round, and reactive to light.  Cardiovascular:     Rate and Rhythm: Normal rate and regular rhythm.     Heart sounds: No murmur heard.    No friction rub. No gallop.  Pulmonary:     Effort: Pulmonary effort is normal.     Breath sounds: No stridor. No wheezing, rhonchi or rales.  Abdominal:     General: Abdomen is  flat.     Palpations: There is no mass.     Tenderness: There is no abdominal tenderness. There is no guarding.     Hernia: No hernia is present.  Musculoskeletal:        General: Normal range of motion.     Cervical back: Neck supple.  Lymphadenopathy:     Cervical: No cervical adenopathy.  Skin:    General: Skin is warm and dry.  Neurological:     General: No focal deficit present.     Mental Status: She is alert.  Psychiatric:        Mood and Affect: Mood normal.         Behavior: Behavior normal.     Lab Results  Component Value Date   WBC 4.5 10/05/2023   HGB 13.3 10/05/2023   HCT 39.8 10/05/2023   PLT 233.0 10/05/2023   GLUCOSE 87 10/05/2023   CHOL 178 10/05/2023   TRIG 60.0 10/05/2023   HDL 60.10 10/05/2023   LDLCALC 106 (H) 10/05/2023   ALT 15 10/05/2023   AST 16 10/05/2023   NA 138 10/05/2023   K 3.6 10/05/2023   CL 104 10/05/2023   CREATININE 0.64 10/05/2023   BUN 10 10/05/2023   CO2 27 10/05/2023   TSH 2.72 10/05/2023   HGBA1C 5.2 10/05/2023    No results found.  Assessment & Plan:  Routine general medical examination at a health care facility- Exam completed, labs reviewed, vaccines reviewed and updated, cancer screenings addressed, pt ed material was given.  -     Lipid panel; Future  Impaired glucose tolerance -     Hepatic function panel; Future -     Hemoglobin A1c; Future  PAF (paroxysmal atrial fibrillation) (HCC)- She has good R/R control. -     TSH; Future -     Hepatic function panel; Future -     CBC with Differential/Platelet; Future -     Basic metabolic panel with GFR; Future  Idiopathic hypotension- Labs are reassuring with the exception of mildly low cortisol. -     TSH; Future -     Hepatic function panel; Future -     CBC with Differential/Platelet; Future -     Basic metabolic panel with GFR; Future -     Cortisol; Future  GAD (generalized anxiety disorder) -     Sertraline HCl; Take 1 tablet (100 mg total) by mouth daily.  Dispense: 90 tablet; Refill: 1  Cervical cancer screening -     Ambulatory referral to Gynecology  Low serum cortisol level- Will monitor for AI.     Follow-up: Return in about 6 months (around 04/05/2024).  Sanda Linger, MD

## 2023-10-06 DIAGNOSIS — R7989 Other specified abnormal findings of blood chemistry: Secondary | ICD-10-CM | POA: Insufficient documentation

## 2023-10-07 ENCOUNTER — Encounter: Payer: Self-pay | Admitting: Internal Medicine

## 2023-10-09 ENCOUNTER — Other Ambulatory Visit: Payer: Self-pay | Admitting: Internal Medicine

## 2023-10-09 DIAGNOSIS — R7989 Other specified abnormal findings of blood chemistry: Secondary | ICD-10-CM

## 2023-10-11 ENCOUNTER — Other Ambulatory Visit: Payer: Self-pay | Admitting: Internal Medicine

## 2023-10-11 DIAGNOSIS — R7989 Other specified abnormal findings of blood chemistry: Secondary | ICD-10-CM

## 2023-11-22 ENCOUNTER — Ambulatory Visit (INDEPENDENT_AMBULATORY_CARE_PROVIDER_SITE_OTHER)

## 2023-11-22 ENCOUNTER — Other Ambulatory Visit

## 2023-11-22 DIAGNOSIS — R7989 Other specified abnormal findings of blood chemistry: Secondary | ICD-10-CM

## 2023-11-22 MED ORDER — COSYNTROPIN 0.25 MG IJ SOLR
0.2500 mg | Freq: Once | INTRAMUSCULAR | Status: AC
  Administered 2023-11-22: 0.25 mg via INTRAVENOUS

## 2023-11-22 NOTE — Progress Notes (Signed)
 After obtaining consent, and per orders of Dr. Aldona Amel, injection of Cortrosyn 1ml given by Shevaun Lovan L Maryfrances Portugal in right arm .

## 2023-11-23 LAB — CORTISOL, 3 SPECIMEN
SPECIMEN 2: 27.7 ug/dL
SPECIMEN 3: 34.1 ug/dL
Specimen: 8.1 ug/dL
TIME 1: 816
TIME 3: 919
Time 2: 849

## 2023-11-23 LAB — DHEA-SULFATE: DHEA-SO4: 233 ug/dL (ref 19–237)

## 2023-11-25 LAB — ACTH: C206 ACTH: 9 pg/mL (ref 6–50)

## 2023-11-29 ENCOUNTER — Encounter: Payer: Self-pay | Admitting: Internal Medicine

## 2023-12-06 ENCOUNTER — Ambulatory Visit: Payer: Self-pay | Admitting: Internal Medicine

## 2023-12-07 ENCOUNTER — Encounter: Payer: Self-pay | Admitting: Internal Medicine

## 2024-01-04 ENCOUNTER — Ambulatory Visit: Admitting: Internal Medicine

## 2024-04-16 ENCOUNTER — Encounter: Payer: Self-pay | Admitting: Internal Medicine

## 2024-04-17 ENCOUNTER — Other Ambulatory Visit: Payer: Self-pay | Admitting: Internal Medicine

## 2024-04-17 DIAGNOSIS — K625 Hemorrhage of anus and rectum: Secondary | ICD-10-CM | POA: Insufficient documentation

## 2024-04-19 ENCOUNTER — Encounter: Payer: Self-pay | Admitting: Family Medicine

## 2024-04-19 ENCOUNTER — Ambulatory Visit: Admitting: Family Medicine

## 2024-04-19 VITALS — BP 114/72 | HR 80 | Temp 97.9°F | Ht 62.0 in | Wt 137.0 lb

## 2024-04-19 DIAGNOSIS — K649 Unspecified hemorrhoids: Secondary | ICD-10-CM | POA: Insufficient documentation

## 2024-04-19 DIAGNOSIS — F411 Generalized anxiety disorder: Secondary | ICD-10-CM

## 2024-04-19 DIAGNOSIS — K625 Hemorrhage of anus and rectum: Secondary | ICD-10-CM

## 2024-04-19 MED ORDER — SERTRALINE HCL 100 MG PO TABS: 100.0000 mg | ORAL_TABLET | Freq: Every day | ORAL | 1 refills | Status: DC

## 2024-04-19 NOTE — Progress Notes (Signed)
 Acute Office Visit  Subjective:     Patient ID: Jasmine Bell, female    DOB: 03/10/1986, 38 y.o.   MRN: [id number]  Chief Complaint  Patient presents with   Referral    Patient here for GI referral     HPI  Discussed the use of AI scribe software for clinical note transcription with the patient, who gave verbal consent to proceed.  History of Present Illness Jasmine Bell is a 37 year old female who presents with rectal bleeding and hemorrhoids.  Rectal bleeding - Intermittent rectal bleeding for the past two weeks - Blood present in the stool, on toilet paper, and in the toilet - Bleeding episodes last for a day, resolve, and then recur - No associated significant pain  Hemorrhoidal symptoms - Irritation associated with hemorrhoids - No significant pain from hemorrhoids - No prior treatments attempted for hemorrhoids  Colorectal cancer risk factors and concerns - No prior colonoscopy - Family history of colon issues in grandmother (details unclear) - Concerned about the potential seriousness of symptoms     ROS Per HPI      Objective:    BP 114/72 (BP Location: Left Arm, Patient Position: Sitting, Cuff Size: Normal)   Pulse 80   Temp 97.9 F (36.6 C) (Oral)   Ht 5' 2 (1.575 m)   Wt 137 lb (62.1 kg)   SpO2 100%   BMI 25.06 kg/m    Physical Exam Vitals and nursing note reviewed.  Constitutional:      General: She is not in acute distress.    Appearance: Normal appearance. She is normal weight.  HENT:     Head: Normocephalic and atraumatic.     Right Ear: External ear normal.     Left Ear: External ear normal.     Nose: Nose normal.     Mouth/Throat:     Mouth: Mucous membranes are moist.     Pharynx: Oropharynx is clear.  Eyes:     Extraocular Movements: Extraocular movements intact.     Pupils: Pupils are equal, round, and reactive to light.  Cardiovascular:     Rate and Rhythm: Normal rate and regular rhythm.     Pulses: Normal  pulses.     Heart sounds: Normal heart sounds.  Pulmonary:     Effort: Pulmonary effort is normal. No respiratory distress.     Breath sounds: Normal breath sounds. No wheezing, rhonchi or rales.  Musculoskeletal:        General: Normal range of motion.     Cervical back: Normal range of motion.     Right lower leg: No edema.     Left lower leg: No edema.  Lymphadenopathy:     Cervical: No cervical adenopathy.  Neurological:     General: No focal deficit present.     Mental Status: She is alert and oriented to person, place, and time.  Psychiatric:        Mood and Affect: Mood normal.        Thought Content: Thought content normal.     No results found for any visits on 04/19/24.      Assessment & Plan:   Assessment and Plan Assessment & Plan Bright red blood per rectum, hemorrhoids Intermittent rectal bleeding likely due to internal hemorrhoids. No significant pain but irritation present. Family history of colon issues noted. Concern due to recent family deaths and lack of prior colonoscopy. - Referred to gastroenterology for evaluation and possible colonoscopy. -  Advised follow-up with gastroenterology if not contacted by Tuesday or Wednesday.  GAD - Chronic, stable, refilled Zoloft  today     Orders Placed This Encounter  Procedures   Ambulatory referral to Gastroenterology    Referral Priority:   Urgent    Referral Type:   Consultation    Referral Reason:   Specialty Services Required    Referred to Provider:   Kriss Estefana DEL, DO    Number of Visits Requested:   1     Meds ordered this encounter  Medications   sertraline  (ZOLOFT ) 100 MG tablet    Sig: Take 1 tablet (100 mg total) by mouth daily.    Dispense:  90 tablet    Refill:  1    Return if symptoms worsen or fail to improve.  Jasmine LITTIE Ku, FNP

## 2024-04-19 NOTE — Patient Instructions (Signed)
 We have sent a referral to [facility] for you.  Someone should be reaching out to get you scheduled. If you do not hear anything by Wednesday next week, call us  and let us  know we can follow-up  Refilled Zoloft   Follow-up with PCP as scheduled, sooner if needed

## 2024-04-24 ENCOUNTER — Telehealth: Payer: Self-pay

## 2024-04-24 NOTE — Telephone Encounter (Signed)
 Copied from CRM #[id number]. Topic: Referral - Question >> Apr 24, 2024 12:52 PM Jasmine Bell wrote: Reason for RMF:Dyjwij from  Jasmine Bell called in regarding referral that was sent to them on behalf of patient. They sent a request on yesterday for OV notes,labs,etc. They received a response back from our office stating that we do not have that information. She is wondering how,because the referral came from our office. They stated that they are needing any information pertaining to the reason she needs to be seen in their office.  Possibly from 04/19/2024 OV,and if any labs were drawn that day as well.

## 2024-04-30 NOTE — Telephone Encounter (Unsigned)
 Copied from CRM #[id number]. Topic: Referral - Question >> Apr 30, 2024 10:47 AM Ashley R wrote: Reason for CRM: Jasmine Bell calling back about further information required for referral. Was received by their office 10/21 with diagnosis, but also need this sent with most recent office notes, labs, insurance information. 10/17 AUTH.  Last fax sent in was 10/22.

## 2024-05-03 NOTE — Telephone Encounter (Signed)
 Spoke with Kathi from [facility], unsure who told her we did not have the requested information for patient as we were the ones who placed referral for patient. Will get requested information faxed over to them.

## 2024-05-03 NOTE — Telephone Encounter (Signed)
 Can you handle this if this has not been handled ?

## 2024-05-20 ENCOUNTER — Other Ambulatory Visit: Payer: Self-pay | Admitting: Internal Medicine

## 2024-05-20 DIAGNOSIS — F411 Generalized anxiety disorder: Secondary | ICD-10-CM

## 2024-05-23 LAB — HM COLONOSCOPY
